# Patient Record
Sex: Male | Born: 1940 | Race: White | Hispanic: No | Smoking: Former smoker
Health system: Southern US, Community
[De-identification: ages and names within clinical notes are randomized; demographics above are authoritative.]

## PROBLEM LIST (undated history)

## (undated) DIAGNOSIS — I739 Peripheral vascular disease, unspecified: Secondary | ICD-10-CM

## (undated) DIAGNOSIS — I2510 Atherosclerotic heart disease of native coronary artery without angina pectoris: Secondary | ICD-10-CM

## (undated) DIAGNOSIS — Z87442 Personal history of urinary calculi: Secondary | ICD-10-CM

## (undated) DIAGNOSIS — I714 Abdominal aortic aneurysm, without rupture, unspecified: Secondary | ICD-10-CM

## (undated) DIAGNOSIS — IMO0002 Reserved for concepts with insufficient information to code with codable children: Secondary | ICD-10-CM

## (undated) DIAGNOSIS — K5790 Diverticulosis of intestine, part unspecified, without perforation or abscess without bleeding: Secondary | ICD-10-CM

## (undated) DIAGNOSIS — M1990 Unspecified osteoarthritis, unspecified site: Secondary | ICD-10-CM

## (undated) DIAGNOSIS — K8689 Other specified diseases of pancreas: Secondary | ICD-10-CM

## (undated) DIAGNOSIS — I10 Essential (primary) hypertension: Secondary | ICD-10-CM

## (undated) DIAGNOSIS — I701 Atherosclerosis of renal artery: Secondary | ICD-10-CM

## (undated) DIAGNOSIS — I219 Acute myocardial infarction, unspecified: Secondary | ICD-10-CM

## (undated) DIAGNOSIS — N200 Calculus of kidney: Secondary | ICD-10-CM

## (undated) DIAGNOSIS — I779 Disorder of arteries and arterioles, unspecified: Secondary | ICD-10-CM

## (undated) DIAGNOSIS — R918 Other nonspecific abnormal finding of lung field: Secondary | ICD-10-CM

## (undated) DIAGNOSIS — E785 Hyperlipidemia, unspecified: Secondary | ICD-10-CM

## (undated) HISTORY — PX: TONSILLECTOMY: SUR1361

## (undated) HISTORY — DX: Diverticulosis of intestine, part unspecified, without perforation or abscess without bleeding: K57.90

## (undated) HISTORY — PX: CORONARY ARTERY BYPASS GRAFT: SHX141

## (undated) HISTORY — DX: Hyperlipidemia, unspecified: E78.5

## (undated) HISTORY — DX: Atherosclerosis of renal artery: I70.1

## (undated) HISTORY — DX: Abdominal aortic aneurysm, without rupture, unspecified: I71.40

## (undated) HISTORY — DX: Personal history of urinary calculi: Z87.442

## (undated) HISTORY — DX: Acute myocardial infarction, unspecified: I21.9

## (undated) HISTORY — DX: Reserved for concepts with insufficient information to code with codable children: IMO0002

## (undated) HISTORY — DX: Peripheral vascular disease, unspecified: I73.9

## (undated) HISTORY — DX: Other nonspecific abnormal finding of lung field: R91.8

## (undated) HISTORY — DX: Other specified diseases of pancreas: K86.89

## (undated) HISTORY — DX: Disorder of arteries and arterioles, unspecified: I77.9

## (undated) HISTORY — DX: Unspecified osteoarthritis, unspecified site: M19.90

## (undated) HISTORY — PX: COLONOSCOPY: SHX174

## (undated) HISTORY — DX: Essential (primary) hypertension: I10

## (undated) HISTORY — PX: SHOULDER SURGERY: SHX246

## (undated) HISTORY — DX: Atherosclerotic heart disease of native coronary artery without angina pectoris: I25.10

---

## 1998-01-28 ENCOUNTER — Other Ambulatory Visit: Admission: RE | Admit: 1998-01-28 | Discharge: 1998-01-28 | Payer: Self-pay | Admitting: Internal Medicine

## 1998-12-13 ENCOUNTER — Encounter: Admission: RE | Admit: 1998-12-13 | Discharge: 1998-12-13 | Payer: Self-pay | Admitting: Family Medicine

## 1998-12-13 ENCOUNTER — Encounter: Payer: Self-pay | Admitting: Family Medicine

## 2000-08-06 ENCOUNTER — Ambulatory Visit (HOSPITAL_COMMUNITY): Admission: RE | Admit: 2000-08-06 | Discharge: 2000-08-06 | Payer: Self-pay | Admitting: Cardiology

## 2000-08-06 ENCOUNTER — Encounter: Payer: Self-pay | Admitting: Cardiology

## 2002-01-02 ENCOUNTER — Encounter: Admission: RE | Admit: 2002-01-02 | Discharge: 2002-01-02 | Payer: Self-pay | Admitting: Cardiology

## 2002-01-02 ENCOUNTER — Encounter: Payer: Self-pay | Admitting: Cardiology

## 2005-03-24 ENCOUNTER — Ambulatory Visit (HOSPITAL_COMMUNITY): Admission: RE | Admit: 2005-03-24 | Discharge: 2005-03-24 | Payer: Self-pay | Admitting: Orthopedic Surgery

## 2005-08-30 HISTORY — PX: OTHER SURGICAL HISTORY: SHX169

## 2005-09-28 ENCOUNTER — Inpatient Hospital Stay (HOSPITAL_COMMUNITY): Admission: EM | Admit: 2005-09-28 | Discharge: 2005-10-03 | Payer: Self-pay | Admitting: Emergency Medicine

## 2005-09-29 ENCOUNTER — Encounter (INDEPENDENT_AMBULATORY_CARE_PROVIDER_SITE_OTHER): Payer: Self-pay | Admitting: *Deleted

## 2005-10-26 ENCOUNTER — Encounter: Admission: RE | Admit: 2005-10-26 | Discharge: 2005-10-26 | Payer: Self-pay | Admitting: Cardiothoracic Surgery

## 2006-01-30 DIAGNOSIS — I219 Acute myocardial infarction, unspecified: Secondary | ICD-10-CM

## 2006-01-30 HISTORY — DX: Acute myocardial infarction, unspecified: I21.9

## 2007-01-31 ENCOUNTER — Emergency Department (HOSPITAL_COMMUNITY): Admission: EM | Admit: 2007-01-31 | Discharge: 2007-01-31 | Payer: Self-pay | Admitting: Emergency Medicine

## 2008-01-02 ENCOUNTER — Encounter: Payer: Self-pay | Admitting: Cardiovascular Disease

## 2008-03-17 ENCOUNTER — Encounter (INDEPENDENT_AMBULATORY_CARE_PROVIDER_SITE_OTHER): Payer: Self-pay | Admitting: *Deleted

## 2008-07-03 ENCOUNTER — Encounter: Admission: RE | Admit: 2008-07-03 | Discharge: 2008-07-03 | Payer: Self-pay | Admitting: Family Medicine

## 2008-08-31 ENCOUNTER — Encounter: Payer: Self-pay | Admitting: Cardiovascular Disease

## 2008-08-31 LAB — CONVERTED CEMR LAB
AST: 21 units/L
Alkaline Phosphatase: 67 units/L
Cholesterol: 145 mg/dL
Total Bilirubin: 0.6 mg/dL
Triglycerides: 168 mg/dL

## 2009-03-03 ENCOUNTER — Ambulatory Visit: Payer: Self-pay | Admitting: Cardiovascular Disease

## 2009-03-03 DIAGNOSIS — I25810 Atherosclerosis of coronary artery bypass graft(s) without angina pectoris: Secondary | ICD-10-CM | POA: Insufficient documentation

## 2009-03-03 DIAGNOSIS — E785 Hyperlipidemia, unspecified: Secondary | ICD-10-CM

## 2009-03-03 DIAGNOSIS — I10 Essential (primary) hypertension: Secondary | ICD-10-CM | POA: Insufficient documentation

## 2009-03-09 ENCOUNTER — Encounter: Payer: Self-pay | Admitting: Cardiovascular Disease

## 2009-03-18 ENCOUNTER — Encounter: Payer: Self-pay | Admitting: Cardiovascular Disease

## 2009-05-10 ENCOUNTER — Emergency Department (HOSPITAL_COMMUNITY): Admission: EM | Admit: 2009-05-10 | Discharge: 2009-05-10 | Payer: Self-pay | Admitting: Emergency Medicine

## 2009-05-14 ENCOUNTER — Ambulatory Visit: Payer: Self-pay | Admitting: Cardiology

## 2009-05-14 DIAGNOSIS — H8110 Benign paroxysmal vertigo, unspecified ear: Secondary | ICD-10-CM

## 2009-05-28 ENCOUNTER — Telehealth: Payer: Self-pay | Admitting: Cardiovascular Disease

## 2009-05-28 ENCOUNTER — Ambulatory Visit: Payer: Self-pay | Admitting: Cardiology

## 2009-05-31 LAB — CONVERTED CEMR LAB
BUN: 13 mg/dL (ref 6–23)
CO2: 26 meq/L (ref 19–32)
Calcium: 9.1 mg/dL (ref 8.4–10.5)
Chloride: 106 meq/L (ref 96–112)
Creatinine, Ser: 1.12 mg/dL (ref 0.40–1.50)
Glucose, Bld: 101 mg/dL — ABNORMAL HIGH (ref 70–99)
Potassium: 4.3 meq/L (ref 3.5–5.3)
Sodium: 140 meq/L (ref 135–145)

## 2009-06-02 ENCOUNTER — Encounter: Payer: Self-pay | Admitting: Cardiovascular Disease

## 2009-06-11 ENCOUNTER — Telehealth: Payer: Self-pay | Admitting: Cardiology

## 2009-07-07 ENCOUNTER — Encounter: Payer: Self-pay | Admitting: Cardiovascular Disease

## 2009-09-02 ENCOUNTER — Ambulatory Visit: Payer: Self-pay | Admitting: Cardiovascular Disease

## 2009-09-06 ENCOUNTER — Ambulatory Visit: Payer: Self-pay | Admitting: Cardiovascular Disease

## 2009-09-08 LAB — CONVERTED CEMR LAB
ALT: 21 units/L (ref 0–53)
AST: 23 units/L (ref 0–37)
Albumin: 4.3 g/dL (ref 3.5–5.2)
Alkaline Phosphatase: 60 units/L (ref 39–117)
Bilirubin, Direct: 0.2 mg/dL (ref 0.0–0.3)
Cholesterol: 123 mg/dL (ref 0–200)
HDL: 48 mg/dL (ref >39)
Indirect Bilirubin: 0.5 mg/dL (ref 0.0–0.9)
LDL Cholesterol: 62 mg/dL (ref 0–99)
Total Bilirubin: 0.7 mg/dL (ref 0.3–1.2)
Total CHOL/HDL Ratio: 2.6
Total Protein: 6.5 g/dL (ref 6.0–8.3)
Triglycerides: 67 mg/dL (ref <150)
VLDL: 13 mg/dL (ref 0–40)

## 2009-09-30 ENCOUNTER — Telehealth: Payer: Self-pay | Admitting: Cardiovascular Disease

## 2010-01-04 ENCOUNTER — Telehealth: Payer: Self-pay | Admitting: Cardiology

## 2010-02-09 ENCOUNTER — Encounter: Payer: Self-pay | Admitting: Cardiovascular Disease

## 2010-02-09 ENCOUNTER — Ambulatory Visit
Admission: RE | Admit: 2010-02-09 | Discharge: 2010-02-09 | Payer: Self-pay | Source: Home / Self Care | Attending: Cardiovascular Disease | Admitting: Cardiovascular Disease

## 2010-02-10 ENCOUNTER — Ambulatory Visit
Admission: RE | Admit: 2010-02-10 | Discharge: 2010-02-10 | Payer: Self-pay | Source: Home / Self Care | Attending: Cardiovascular Disease | Admitting: Cardiovascular Disease

## 2010-02-10 ENCOUNTER — Encounter: Payer: Self-pay | Admitting: Cardiovascular Disease

## 2010-02-11 ENCOUNTER — Inpatient Hospital Stay (HOSPITAL_COMMUNITY)
Admission: AD | Admit: 2010-02-11 | Discharge: 2010-02-14 | Payer: Self-pay | Source: Home / Self Care | Attending: Orthopedic Surgery | Admitting: Orthopedic Surgery

## 2010-02-14 LAB — CBC
HCT: 43.5 % (ref 39.0–52.0)
HCT: 39 % (ref 39.0–52.0)
HCT: 39.5 % (ref 39.0–52.0)
Hemoglobin: 14.9 g/dL (ref 13.0–17.0)
Hemoglobin: 13 g/dL (ref 13.0–17.0)
Hemoglobin: 13.4 g/dL (ref 13.0–17.0)
MCH: 32 pg (ref 26.0–34.0)
MCH: 31.4 pg (ref 26.0–34.0)
MCH: 31.7 pg (ref 26.0–34.0)
MCHC: 34.3 g/dL (ref 30.0–36.0)
MCHC: 33.3 g/dL (ref 30.0–36.0)
MCHC: 33.9 g/dL (ref 30.0–36.0)
MCV: 93.5 fL (ref 78.0–100.0)
MCV: 94.2 fL (ref 78.0–100.0)
MCV: 93.4 fL (ref 78.0–100.0)
Platelets: 139 10*3/uL — ABNORMAL LOW (ref 150–400)
Platelets: 131 10*3/uL — ABNORMAL LOW (ref 150–400)
Platelets: 143 10*3/uL — ABNORMAL LOW (ref 150–400)
RBC: 4.65 MIL/uL (ref 4.22–5.81)
RBC: 4.14 MIL/uL — ABNORMAL LOW (ref 4.22–5.81)
RBC: 4.23 MIL/uL (ref 4.22–5.81)
RDW: 13.2 % (ref 11.5–15.5)
RDW: 13.1 % (ref 11.5–15.5)
RDW: 12.9 % (ref 11.5–15.5)
WBC: 12.5 10*3/uL — ABNORMAL HIGH (ref 4.0–10.5)
WBC: 7 10*3/uL (ref 4.0–10.5)
WBC: 8.7 10*3/uL (ref 4.0–10.5)

## 2010-02-14 LAB — COMPREHENSIVE METABOLIC PANEL
ALT: 20 U/L (ref 0–53)
AST: 19 U/L (ref 0–37)
Albumin: 3.8 g/dL (ref 3.5–5.2)
Alkaline Phosphatase: 60 U/L (ref 39–117)
BUN: 15 mg/dL (ref 6–23)
CO2: 26 mEq/L (ref 19–32)
Calcium: 9.1 mg/dL (ref 8.4–10.5)
Chloride: 104 mEq/L (ref 96–112)
Creatinine, Ser: 1.21 mg/dL (ref 0.4–1.5)
GFR calc Af Amer: >60 mL/min (ref >60)
GFR calc non Af Amer: 59 mL/min — ABNORMAL LOW (ref >60)
Glucose, Bld: 115 mg/dL — ABNORMAL HIGH (ref 70–99)
Potassium: 4.3 mEq/L (ref 3.5–5.1)
Sodium: 140 mEq/L (ref 135–145)
Total Bilirubin: 1 mg/dL (ref 0.3–1.2)
Total Protein: 7.4 g/dL (ref 6.0–8.3)

## 2010-02-14 LAB — CONVERTED CEMR LAB
ALT: 19 units/L (ref 0–53)
AST: 18 units/L (ref 0–37)
Alkaline Phosphatase: 67 units/L (ref 39–117)
Bilirubin, Direct: 0.2 mg/dL (ref 0.0–0.3)
Cholesterol: 134 mg/dL (ref 0–200)
Total Bilirubin: 0.8 mg/dL (ref 0.3–1.2)
Total CHOL/HDL Ratio: 2.8

## 2010-02-14 LAB — URINALYSIS, ROUTINE W REFLEX MICROSCOPIC
Hgb urine dipstick: NEGATIVE
Ketones, ur: 40 mg/dL — AB
Leukocytes, UA: NEGATIVE
Nitrite: NEGATIVE
Protein, ur: 30 mg/dL — AB
Specific Gravity, Urine: 1.028 (ref 1.005–1.030)
Urine Glucose, Fasting: NEGATIVE mg/dL
Urobilinogen, UA: 0.2 mg/dL (ref 0.0–1.0)
pH: 5.5 (ref 5.0–8.0)

## 2010-02-14 LAB — URINE MICROSCOPIC-ADD ON

## 2010-02-14 LAB — SEDIMENTATION RATE: Sed Rate: 29 mm/hr — ABNORMAL HIGH (ref 0–16)

## 2010-02-16 LAB — CBC
HCT: 40 % (ref 39.0–52.0)
Hemoglobin: 13.4 g/dL (ref 13.0–17.0)
MCH: 31.1 pg (ref 26.0–34.0)
MCHC: 33.5 g/dL (ref 30.0–36.0)
MCV: 92.8 fL (ref 78.0–100.0)
Platelets: 152 10*3/uL (ref 150–400)
RBC: 4.31 MIL/uL (ref 4.22–5.81)
RDW: 12.7 % (ref 11.5–15.5)
WBC: 7.4 10*3/uL (ref 4.0–10.5)

## 2010-02-16 LAB — CREATININE, SERUM
Creatinine, Ser: 1.02 mg/dL (ref 0.4–1.5)
GFR calc Af Amer: >60 mL/min (ref >60)
GFR calc non Af Amer: >60 mL/min (ref >60)

## 2010-02-27 NOTE — Discharge Summary (Addendum)
  [name], [name]                ACCOUNT NO.:  [id number]  MEDICAL RECORD NO.:  [id number]          PATIENT TYPE:  INP  LOCATION:  1333                         FACILITY:  [facility]  PHYSICIAN:  Georges Lynch. Gioffre, M.D.DATE OF BIRTH:  1940/02/15  DATE OF ADMISSION:  02/11/2010 DATE OF DISCHARGE:  02/14/2010                              DISCHARGE SUMMARY   ADMITTING DIAGNOSIS:  Left prepatellar cellulitis.  DISCHARGE DIAGNOSIS:  Left prepatellar cellulitis improving after IV antibiotics.  LABORATORY DATA:  Admitting labs taken on February 11, 2010 revealed a white count of 12.5, hemoglobin 14.9, hematocrit 43.5 and platelet count of 139,000.  Chemistry panel revealed mildly elevated glucose at 115 and urine was positive for ketones and protein with some mucus.  On the admission day 2, CBC revealed white count that had normalized to 7.0, hemoglobin 13, hematocrit of 39.0, and platelet count of 131,000. The patient's sed rate was elevated at 29.  On admission day 3, white count remained within normal limits at 8.7, hemoglobin 13.4, hematocrit 39.5, and platelet count of 143,000. On admission day which is also discharge day, the patient's white count again remained within normal limits at 7.4, hemoglobin 13.4, hematocrit 40, and platelet count of 152,000.  HOSPITAL COURSE:  The patient was seen at [facility] on February 11, 2010 and had what appeared to be a cellulitis developing on the anterior aspect of the left knee.  He was sent to [facility] for admission to receive IV antibiotics to treat cellulitis. On February 12, 2010, the patient's white count normalized after receiving IV antibiotics overnight.  On February 14, 2010, the patient's cellulitis seemed to be greatly improved and he was discharged home on p.o. antibiotics.  DISPOSITION:  To home on February 14, 2010.  PROCEDURE:  None.  DISCHARGE MEDICATIONS: 1. Multivitamin. 2. Amlodipine. 3.  Niacor. 4. Aspirin. 5. Lisinopril. 6. Metoprolol. 7. Simvastatin. 8. Doxycycline. 9. Oxycodone/acetaminophen.  DIET:  No restrictions.  WOUND CARE:  Not applicable.  ACTIVITY:  He should increase his activity slowly.  FOLLOWUP:  He will follow up with Dr. Darrelyn Hillock on Thursday.  He should call the office at [phone] to schedule this appointment.  CONDITION ON DISCHARGE:  Improving.     Rozell Searing, PAC   ______________________________ Georges Lynch Darrelyn Hillock, M.D.    LD/MEDQ  D:  02/24/2010  T:  02/24/2010  Job:  [id number]  Electronically Signed by Rozell Searing  on 02/27/2010 03:53:31 PM Electronically Signed by Ranee Gosselin M.D. on 02/28/2010 08:05:54 AM

## 2010-03-01 NOTE — Assessment & Plan Note (Signed)
Summary: 6 months   Visit Type:  Follow-up Primary [name]:  Dr. Jeannetta Nap   History of Present Illness: [name] is a 70 yo,    coronary artery disease, bypass surgery in August of 2007 with a LIMA  to the LAD, vein graft to the diagonal, and graft to the OM and PDA of the circumflex, as well as history of hypertension and hyperlipidemia.  He presents for routine follow up. He has recently recovered from vertigo, seen by ENT.  BP has been stable. He has noted some mild swelling of his LE b/l. He has been active, remodeling a house. No chest pain, no SOB. Otherwise feels very good when active.  EKG: NSR with rate of 64 bpm, nonspecific ST changes in inferior leads.  Current Medications (verified): 1)  Metoprolol Tartrate 25 Mg Tabs (Metoprolol Tartrate) .... Take One Tablet By Mouth Twice A Day 2)  Lisinopril 20 Mg Tabs (Lisinopril) .... Take One Tablet By Mouth Daily 3)  Simvastatin 40 Mg Tabs (Simvastatin) .... Take One Tablet By Mouth Daily At Bedtime 4)  Niacor 500 Mg Tabs (Niacin) .... 2 By Mouth Two Times A Day 5)  One-A-Day Extras Antioxidant  Caps (Multiple Vitamins-Minerals) .Marland Kitchen.. 1 By Mouth Once Daily 6)  Aspirin 81 Mg Tbec (Aspirin) .... Take 2 Tablets By Mouth Daily 7)  Meclizine Hcl 25 Mg Tabs (Meclizine Hcl) .... Take 1 By Mouth Three Times A Day As Needed For Dizziness 8)  Amlodipine Besylate 10 Mg Tabs (Amlodipine Besylate) .... Take One Tablet By Mouth Daily  Allergies (verified): 1)  ! Codeine  Past History:  Past Medical History: Last updated: 05/14/2009 1. Arthritis 2. CAD:  CABG 2007 with LIMA-LAD,  SVG-D, sequential SVG-OM and CFX PDA.  Myoview 12/09 was probably a negative study with attenuation artifact.  3. Hyperlipidemia 4. Hypertension 5. Positional vertigo  Past Surgical History: Last updated: 03/09/2009 CABG Bypass surgery August 2007  Family History: Last updated: 03-13-2009 Father: deceased 74 : alzeimer's Mother: deceased 72: old  age Sister deceased 50: DM  lots of health problems  Social History: Last updated: Mar 13, 2009 Retired  Married  Tobacco Use - Former.  Alcohol Use - no Regular Exercise - yes Drug Use - no  Risk Factors: Alcohol Use: 0 (Mar 13, 2009) Caffeine Use: no (03-13-2009) Exercise: yes (March 13, 2009)  Risk Factors: Smoking Status: quit (03-13-09)  Review of Systems       The patient complains of peripheral edema.  The patient denies fever, weight loss, weight gain, vision loss, decreased hearing, hoarseness, chest pain, syncope, dyspnea on exertion, prolonged cough, abdominal pain, incontinence, muscle weakness, depression, and enlarged lymph nodes.    Vital Signs:  Patient profile:   70 year old male Height:      72.5 inches Weight:      219 pounds BMI:     29.40 Pulse rate:   65 / minute BP sitting:   163 / 87  (left arm) Cuff size:   regular  Vitals Entered By: Bishop Dublin, CMA (September 02, 2009 10:41 AM)  Physical Exam  General:  Well developed, well nourished, in no acute distress. Head:  normocephalic and atraumatic Neck:  Neck supple, no JVD. No masses, thyromegaly or abnormal cervical nodes. Chest Wall:  no deformities or breast masses noted Lungs:  Clear bilaterally to auscultation and percussion. Heart:  Non-displaced PMI, chest non-tender; regular rate and rhythm, S1, S2 without murmurs, rubs or gallops. Carotid upstroke normal, no bruit.  Pedals normal pulses. Trace LE  edema b/l, no varicosities. Abdomen:  Bowel sounds positive; abdomen soft and non-tender without masses Msk:  Back normal, normal gait. Muscle strength and tone normal. Pulses:  pulses normal in all 4 extremities Extremities:  No clubbing or cyanosis. Neurologic:  Alert and oriented x 3. Skin:  Intact without lesions or rashes. Psych:  Normal affect.   Impression & Recommendations:  Problem # 1:  CORONARY ATHEROSCLEROSIS OF ARTERY BYPASS GRAFT (ICD-414.04) No signs of angina. Very active at  baseline. no further testing at this time.   His updated medication list for this problem includes:    Metoprolol Tartrate 25 Mg Tabs (Metoprolol tartrate) .Marland Kitchen... Take one tablet by mouth twice a day    Lisinopril 20 Mg Tabs (Lisinopril) .Marland Kitchen... Take one tablet by mouth daily    Aspirin 81 Mg Tbec (Aspirin) .Marland Kitchen... Take 2 tablets by mouth daily    Amlodipine Besylate 10 Mg Tabs (Amlodipine besylate) .Marland Kitchen... Take one tablet by mouth daily  Orders: EKG w/ Interpretation (93000)  Problem # 2:  HYPERTENSION, BENIGN (ICD-401.1) BP significantly improved on recheck. 125/80. will continue meds at their current doses. He feels well. His updated medication list for this problem includes:    Metoprolol Tartrate 25 Mg Tabs (Metoprolol tartrate) .Marland Kitchen... Take one tablet by mouth twice a day    Lisinopril 20 Mg Tabs (Lisinopril) .Marland Kitchen... Take one tablet by mouth daily    Aspirin 81 Mg Tbec (Aspirin) .Marland Kitchen... Take 2 tablets by mouth daily    Amlodipine Besylate 10 Mg Tabs (Amlodipine besylate) .Marland Kitchen... Take one tablet by mouth daily  Problem # 3:  HYPERLIPIDEMIA (ICD-272.4) Lipids last year were well controlled.  will recheck lipids/lfts tomorrow.  His updated medication list for this problem includes:    Simvastatin 40 Mg Tabs (Simvastatin) .Marland Kitchen... Take one tablet by mouth daily at bedtime    Niacor 500 Mg Tabs (Niacin) .Marland Kitchen... 2 by mouth two times a day  Patient Instructions: 1)  Your physician recommends that you continue on your current medications as directed. Please refer to the Current Medication list given to you today. 2)  Your physician wants you to follow-up in:   6 months You will receive a reminder letter in the mail two months in advance. If you don't receive a letter, please call our office to schedule the follow-up appointment. 3)  Your physician recommends that you return for a FASTING lipid profile: (lip/lft)

## 2010-03-01 NOTE — Consult Note (Signed)
Summary: [facility]   Imported By: Harlon Flor 07/07/2009 15:31:03  _____________________________________________________________________  External Attachment:    Type:   Image     Comment:   External Document

## 2010-03-01 NOTE — Progress Notes (Signed)
Summary: PHI  PHI   Imported By: Harlon Flor 03/04/2009 16:54:30  _____________________________________________________________________  External Attachment:    Type:   Image     Comment:   External Document

## 2010-03-01 NOTE — Progress Notes (Signed)
Summary: RX amlodipine  Phone Note Refill Request Call back at Home Phone [phone] Message from:  Patient on September 30, 2009 8:59 AM  Refills Requested: Medication #1:  AMLODIPINE BESYLATE 10 MG TABS Take one tablet by mouth daily. [facility] [phone]  Initial call taken by: Harlon Flor,  September 30, 2009 9:00 AM    Prescriptions: AMLODIPINE BESYLATE 10 MG TABS (AMLODIPINE BESYLATE) Take one tablet by mouth daily  #30 x 3   Entered by:   Bishop Dublin, CMA   Authorized by:   Dossie Arbour MD   Signed by:   Bishop Dublin, CMA on 09/30/2009   Method used:   Electronically to        [facility]* (retail)       [address]       [place], [place]  [address]       Ph: [phone]       Fax: [phone]   RxID:   [id number]

## 2010-03-01 NOTE — Letter (Signed)
Summary: Medical Record Release  Medical Record Release   Imported By: Harlon Flor 03/18/2009 15:17:41  _____________________________________________________________________  External Attachment:    Type:   Image     Comment:   External Document

## 2010-03-01 NOTE — Assessment & Plan Note (Signed)
Summary: ROV/AMD   Primary Provider:  Dr. Jeannetta Greene  CC:  ROV; Still C/O dizzy/nausea while getting up/quick movements; No Chest discomfort.  History of Present Illness: [name] is a 70 yo patient of Dr. Windell Greene.   He has a history of coronary artery disease, bypass surgery in August of 2007 with a LIMA  to the LAD, vein graft to the diagonal, and graft to the OM and PDA of the circumflex, as well as history of hypertension and hyperlipidemia.  He presents for cardiology evaluation after recent urgent care visit at [name].   For 3 wks, patient has been having vertigo symptoms.  When he turns his head to the left abruptly or when he bends down and stands up quickly, he gets a spinning sensation and feels nausea.  The symptoms will last < 1 minute but are very bothersome.  This has gradually gotten worse over that time.  He has not fallen but has been unsteady when he gets the spinning sensation.  He went to urgent care at [name] and it was noted that his systolic blood pressure was in the 160s-170s.  They told him to followup with his cardiologist to make sure that this was not causing the symptoms.  They gave him meclizine which has not been doing much to help him.  He brings in home BP readings which have ranged from 143-160/92-110.  This is considerably higher than normal for him.    We reproduced his symptoms with him standing up, bending over, then straightening up again.  He had a few beats of nystagmus.   Current Medications (verified): 1)  Metoprolol Tartrate 25 Mg Tabs (Metoprolol Tartrate) .... Take One Tablet By Mouth Twice A Day 2)  Lisinopril 10 Mg Tabs (Lisinopril) .... Take One Tablet By Mouth Daily 3)  Simvastatin 40 Mg Tabs (Simvastatin) .... Take One Tablet By Mouth Daily At Bedtime 4)  Niacor 500 Mg Tabs (Niacin) .... 2 By Mouth Two Times A Day 5)  One-A-Day Extras Antioxidant  Caps (Multiple Vitamins-Minerals) .Marland Kitchen.. 1 By Mouth Once Daily 6)  Aspirin 81 Mg Tbec  (Aspirin) .... Take 2 Tablets By Mouth Daily 7)  Meclizine Hcl 25 Mg Tabs (Meclizine Hcl) .... Take 1 By Mouth Three Times A Day As Needed For Dizziness  Allergies: 1)  ! Codeine  Past History:  Past Medical History: 1. Arthritis 2. CAD:  CABG 2007 with LIMA-LAD,  SVG-D, sequential SVG-OM and CFX PDA.  Myoview 12/09 was probably a negative study with attenuation artifact.  3. Hyperlipidemia 4. Hypertension 5. Positional vertigo  Family History: Reviewed history from 03/03/2009 and no changes required. Father: deceased 11 : alzeimer's Mother: deceased 50: old age Sister deceased 53: DM  lots of health problems  Social History: Reviewed history from 03/03/2009 and no changes required. Retired  Married  Tobacco Use - Former.  Alcohol Use - no Regular Exercise - yes Drug Use - no  Review of Systems       All systems reviewed and negative except as per HPI.   Vital Signs:  Patient profile:   71 year old male Height:      72.5 inches Weight:      224 pounds Pulse rate:   72 / minute Pulse rhythm:   regular BP sitting:   154 / 88  (left arm)  Vitals Entered By: [name], EMT-P (May 14, 2009 9:50 AM)  Physical Exam  General:  well-appearing gentleman in no apparent distress. HEENT exam  is benign, oropharynx is clear, neck is supple with no JVP or carotid bruits heart sounds are regular with normal S1 and S2 and no murmurs appreciated lungs are clear to auscultation with no wheezes or rales abdominal exam is benign and he has no significant lower extremity edema. Pulses are equal and symmetrical in his upper and lower extremities.   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  We were able to reproduce his symptoms in the office today with a few beats of nystagmus noted.  I recommended that he see an ENT for Epley maneuvers.  He would like to see Dr. Jac Greene, we will arrange for  this.   Problem # 2:  HYPERTENSION, BENIGN (ICD-401.1) BP has been elevated since the vertigo developed.  I suspect it is the vertigo causing some anxiety and discomfort that drives the blood pressure, rather than the other way around.  I will have him increase lisinopril to 20 mg daily with BMET in 2 wks to follow renal fxn and K.  He will call us if his BP does not stabilize out.   Followup with Dr. Mariah Greene as scheduled.   Other Orders: ENT Referral (ENT)  Patient Instructions: 1)  Your physician recommends that you schedule a follow-up appointment in: July with Dr. Mariah Greene 2)  Your physician recommends that you return for lab work in: 2 weeks (bmet) 3)  Your physician has recommended you make the following change in your medication: increase lisinopril to 20 mg daily  4)  You have been referred to Dr. Jac Greene ENT Prescriptions: LISINOPRIL 20 MG TABS (LISINOPRIL) Take one tablet by mouth daily  #30 x 6   Entered by:   Bradley Cross, RN, BSN   Authorized by:   Bradley Ancona, MD   Signed by:   Bradley Cross, RN, BSN on 05/14/2009   Method used:   Electronically to        [facility]* (retail)       [address]       [place], [place]  [address]       Ph: [phone]       Fax: [phone]   RxID:   [id number]

## 2010-03-01 NOTE — Progress Notes (Signed)
Summary: BP issues  Phone Note Other Incoming   Summary of Call: pt in for labwork today.  states that he was not feeling well yesterday so he went to fire station and had BP checked.  BP 160/98 HR 80.   Initial call taken by: Charlena Cross, RN, BSN,  May 28, 2009 4:13 PM  Follow-up for Phone Call        Add amlodipine 10 mg daily for BP (start with 5 mg to start x 1 week, monitor BP), continue lisonopril     Appended Document: BP issues will call in rx to pharmacy at [facility]. pt aware.

## 2010-03-01 NOTE — Miscellaneous (Signed)
Summary: med update  Clinical Lists Changes  Medications: Added new medication of AMLODIPINE BESYLATE 10 MG TABS (AMLODIPINE BESYLATE) Take one tablet by mouth daily - Signed Rx of AMLODIPINE BESYLATE 10 MG TABS (AMLODIPINE BESYLATE) Take one tablet by mouth daily;  #30 x 3;  Signed;  Entered by: Mercer Pod;  Authorized by: Dossie Arbour MD;  Method used: Electronically to [facility]*, [address], [place], [place]  [address], Ph: [phone], Fax: [phone]    Prescriptions: AMLODIPINE BESYLATE 10 MG TABS (AMLODIPINE BESYLATE) Take one tablet by mouth daily  #30 x 3   Entered by:   Mercer Pod   Authorized by:   Dossie Arbour MD   Signed by:   Mercer Pod on 06/02/2009   Method used:   Electronically to        [facility]* (retail)       [address]       [place], [place]  [address]       Ph: [phone]       Fax: [phone]   RxID:   [id number]

## 2010-03-01 NOTE — Progress Notes (Signed)
Summary: MEDICATION  Phone Note Call from Patient Call back at Home Phone [phone]   Caller: WIFE Call For: [name] Summary of Call: WAS GIVEN ON 30 DAYS WORTH OF 20 MG OF LISINOPRIL-SHOULD HE CONTINUE THIS AND GET AN RX OR WOULD YOU LIKE FOR HIM TO GO BACK TO THE 10 MG? Initial call taken by: Harlon Flor,  Jun 11, 2009 11:06 AM  Follow-up for Phone Call        At OV on 05/14/09 Dr [name] incr Lisinopril to 20mg  once daily rx for 30 with 6 refills sent to pharmacy.  Attempted TCB pt.  LMOM TCB. Cloyde Reams RN  Jun 11, 2009 12:05 PM   Spoke with pt advised per Dr Kathlyn Sacramento OV note from 05/14/09 he is supposed to continue on Lisinopril 20mg  once daily.  Rx was sent with refills.  Pt agrees to continue states his BP has been good with this new rx 130's/70's-80's. Follow-up by: Cloyde Reams RN,  Jun 11, 2009 2:30 PM

## 2010-03-01 NOTE — Assessment & Plan Note (Signed)
Summary: NP6/AMD   Visit Type:  New Patient Primary Provider:  Dr. Jeannetta Nap  CC:  no complaints.  History of Present Illness: [name] is a very pleasant 70 year old gentleman last seen by myself at [facility] on August 2000 and who presents to the power to establish care. He has a history of coronary artery disease, bypass surgery in August of 2007 with a LIMA  to the LAD, vein graft to the diagonal, and graft to the OM and PDA of the circumflex, history of hypertension, hyperlipidemia who presents for review of his medical issues.   [name] states that he is feeling well. He denies any significant shortness of breath. He does have rare episodes of chest pain though none recently. He did have one episode several months ago that seemed to resolve when he rolled over onto his side. He is active but does not participate in a regular exercise program. He had a negative stress test in 2010. No significant changes since he was last seen several months ago. His cholesterol has typically been well controlled on his current medication regiment as has his blood pressure.  Preventive Screening-Counseling & Management  Alcohol-Tobacco     Alcohol drinks/day: 0     Smoking Status: quit     Year Quit: about 30 years ago  Caffeine-Diet-Exercise     Caffeine use/day: no     Does Patient Exercise: yes     Type of exercise: walk      Drug Use:  no.    Current Problems (verified): 1)  Hyperlipidemia  (ICD-272.4) 2)  Hypertension, Benign  (ICD-401.1) 3)  Coronary Atherosclerosis of Artery Bypass Graft  (ICD-414.04)  Current Medications (verified): 1)  Metoprolol Tartrate 25 Mg Tabs (Metoprolol Tartrate) .... Take One Tablet By Mouth Twice A Day 2)  Lisinopril 10 Mg Tabs (Lisinopril) .... Take One Tablet By Mouth Daily 3)  Simvastatin 40 Mg Tabs (Simvastatin) .... Take One Tablet By Mouth Daily At Bedtime 4)  Niacor 500 Mg Tabs (Niacin) .... 2 By Mouth Two Times A  Day 5)  One-A-Day Extras Antioxidant  Caps (Multiple Vitamins-Minerals) .Marland Kitchen.. 1 By Mouth Once Daily 6)  Aspirin 81 Mg Tbec (Aspirin) .... Take One Tablet By Mouth Daily  Allergies (verified): 1)  ! Codeine  Past History:  Past Medical History: Arthritis CAD Hyperlipidemia Hypertension  Past Surgical History: CABG  Family History: Father: deceased 60 : alzeimer's Mother: deceased 44: old age Sister deceased 25: DM  lots of health problems  Social History: Retired  Married  Tobacco Use - Former.  Alcohol Use - no Regular Exercise - yes Drug Use - no Alcohol drinks/day:  0 Smoking Status:  quit Caffeine use/day:  no Does Patient Exercise:  yes Drug Use:  no  Vital Signs:  Patient profile:   70 year old male Height:      74.5 inches Weight:      220.50 pounds BMI:     28.03 Pulse rate:   61 / minute Pulse rhythm:   regular BP sitting:   130 / 86  (right arm) Cuff size:   regular  Vitals Entered By: [name] (March 03, 2009 11:25 AM)  Physical Exam  General:  well-appearing gentleman in no apparent distress. HEENT exam is benign, oropharynx is clear, neck is supple with no JVP or carotid bruits heart sounds are regular with normal S1 and S2 and no murmurs appreciated lungs are clear to auscultation with no wheezes or  rales abdominal exam is benign and he has no significant lower extremity edema. Pulses are equal and symmetrical in his upper and lower extremities.   New Orders:     1)  EKG w/ Interpretation (93000)   Impression & Recommendations:  Problem # 1:  CORONARY ATHEROSCLEROSIS OF ARTERY BYPASS GRAFT (ICD-414.04) [name] headache stress test in December 2009 that showed a very small region of perfusion abnormality concerning for attenuation artifact. He is symptom-free but does have rare episodes of chest pain that seem relatively atypical. As he is not having any further symptoms, we will continue medical management at this time. If he  has additional episodes of chest pain, we would repeat a stress test or perform a cardiac catheterization. we did suggest that he increase his aspirin to 2 baby aspirins a day. His updated medication list for this problem includes:    Metoprolol Tartrate 25 Mg Tabs (Metoprolol tartrate) .Marland Kitchen... Take one tablet by mouth twice a day    Lisinopril 10 Mg Tabs (Lisinopril) .Marland Kitchen... Take one tablet by mouth daily    Aspirin 81 Mg Tbec (Aspirin) .Marland Kitchen... Take 2 tablets by mouth daily  Orders: EKG w/ Interpretation (93000)  Problem # 2:  HYPERLIPIDEMIA (ICD-272.4) lipid panel was last performed in 2010 and LDL at that time was 67, total cholesterol 145 and HDL 44. We will continue him on his current dose of simvastatin 40 mg daily with his Niaspan 1000 mg daily. We will recheck his lipids in 6 months time. His updated medication list for this problem includes:    Simvastatin 40 Mg Tabs (Simvastatin) .Marland Kitchen... Take one tablet by mouth daily at bedtime    Niacor 500 Mg Tabs (Niacin) .Marland Kitchen... 2 by mouth two times a day  Problem # 3:  HYPERTENSION, BENIGN (ICD-401.1) blood pressure is well controlled on his current medication regimen of lisinopril 10 mg daily and metoprolol for treatment 25 mg b.i.d. No changes made. His updated medication list for this problem includes:    Metoprolol Tartrate 25 Mg Tabs (Metoprolol tartrate) .Marland Kitchen... Take one tablet by mouth twice a day    Lisinopril 10 Mg Tabs (Lisinopril) .Marland Kitchen... Take one tablet by mouth daily    Aspirin 81 Mg Tbec (Aspirin) .Marland Kitchen... Take 2 tablets by mouth daily  Patient Instructions: 1)  Your physician recommends that you schedule a follow-up appointment in: 6 months 2)  Your physician recommends that you continue on your current medications as directed. Please refer to the Current Medication list given to you today. Prescriptions: METOPROLOL TARTRATE 25 MG TABS (METOPROLOL TARTRATE) Take one tablet by mouth twice a day  #60 x 6   Entered by:   Charlena Cross, RN,  BSN   Authorized by:   Dossie Arbour MD   Signed by:   Charlena Cross, RN, BSN on 03/03/2009   Method used:   Electronically to        [facility]* (retail)       [address]       [place], [place]  [address]       Ph: [phone]       Fax: [phone]   RxID:   [id number]

## 2010-03-01 NOTE — Progress Notes (Signed)
Summary: refill request  Phone Note From Pharmacy   Caller: [facility]* Summary of Call: pharmacy calling for refill of lisinopril 20mg , pt req 90 day supply fax [phone] Initial call taken by: Glynda Jaeger,  January 04, 2010 9:57 AM    Prescriptions: LISINOPRIL 20 MG TABS (LISINOPRIL) Take one tablet by mouth daily  #90 x 3   Entered by:   Kem Parkinson   Authorized by:   Marca Ancona, MD   Signed by:   Kem Parkinson on 01/05/2010   Method used:   Electronically to        [facility]* (retail)       [address]       [place], [place]  [address]       Ph: [phone]       Fax: [phone]   RxID:   [id number]

## 2010-03-03 NOTE — Assessment & Plan Note (Signed)
Summary: F/U 6 MONTHS   Visit Type:  Follow-up Primary Provider:  Dr. Jeannetta Nap  CC:  Denies chest pain or shortness of breath.  He does have some pain in left leg from hitting it on the trailer hitch..  History of Present Illness: [name] is a 70 yo, with a h/o  coronary artery disease, HTN, bypass surgery in August of 2007 with a LIMA  to the LAD, vein graft to the diagonal, and graft to the OM and PDA of the circumflex, as well as history of hypertension and hyperlipidemia.  He presents for routine follow up.  He has been very active, remodeling a house. Recently hurt his left knee with significant swelling. Otherwise feels good, no chest pain, no shortness of breath, no other symptoms.  EKG: NSR with rate of 79 bpm, nonspecific ST changes in inferior leads.  Current Medications (verified): 1)  Metoprolol Tartrate 25 Mg Tabs (Metoprolol Tartrate) .... Take One Tablet By Mouth Twice A Day 2)  Lisinopril 20 Mg Tabs (Lisinopril) .... Take One Tablet By Mouth Daily 3)  Simvastatin 40 Mg Tabs (Simvastatin) .... Take One Tablet By Mouth Daily At Bedtime 4)  Niacor 500 Mg Tabs (Niacin) .... 2 By Mouth Two Times A Day 5)  One-A-Day Extras Antioxidant  Caps (Multiple Vitamins-Minerals) .Marland Kitchen.. 1 By Mouth Once Daily 6)  Aspirin 81 Mg Tbec (Aspirin) .... Take 2 Tablets By Mouth Daily 7)  Meclizine Hcl 25 Mg Tabs (Meclizine Hcl) .... Take 1 By Mouth Three Times A Day As Needed For Dizziness 8)  Amlodipine Besylate 10 Mg Tabs (Amlodipine Besylate) .... Take One Tablet By Mouth Daily  Allergies (verified): 1)  ! Codeine  Past History:  Past Medical History: Last updated: 05/14/2009 1. Arthritis 2. CAD:  CABG 2007 with LIMA-LAD,  SVG-D, sequential SVG-OM and CFX PDA.  Myoview 12/09 was probably a negative study with attenuation artifact.  3. Hyperlipidemia 4. Hypertension 5. Positional vertigo  Past Surgical History: Last updated: 03/09/2009 CABG Bypass surgery August 2007  Family  History: Last updated: 2009/03/07 Father: deceased 11 : alzeimer's Mother: deceased 21: old age Sister deceased 49: DM  lots of health problems  Social History: Last updated: Mar 07, 2009 Retired  Married  Tobacco Use - Former.  Alcohol Use - no Regular Exercise - yes Drug Use - no  Risk Factors: Alcohol Use: 0 (03-07-09) Caffeine Use: no (2009-03-07) Exercise: yes (03/07/09)  Risk Factors: Smoking Status: quit (03/07/2009)  Review of Systems  The patient denies fever, weight loss, weight gain, vision loss, decreased hearing, hoarseness, chest pain, syncope, dyspnea on exertion, peripheral edema, prolonged cough, abdominal pain, incontinence, muscle weakness, depression, and enlarged lymph nodes.         Left knee swelling and pain  Vital Signs:  Patient profile:   70 year old male Height:      72.5 inches Weight:      221 pounds BMI:     29.67 Pulse rate:   79 / minute BP sitting:   144 / 80  (left arm) Cuff size:   regular  Vitals Entered By: Bishop Dublin, CMA (February 10, 2010 2:35 PM)  Physical Exam  General:  Well developed, well nourished, in no acute distress. Head:  normocephalic and atraumatic Neck:  Neck supple, no JVD. No masses, thyromegaly or abnormal cervical nodes. Lungs:  Clear bilaterally to auscultation and percussion. Heart:  Non-displaced PMI, chest non-tender; regular rate and rhythm, S1, S2 without murmurs, rubs or gallops. Carotid upstroke normal, no  bruit.  Pedals normal pulses. Trace LE  edema b/l, no varicosities. Abdomen:  Bowel sounds positive; abdomen soft and non-tender without masses Msk:  Back normal, normal gait. Muscle strength and tone normal. Pulses:  pulses normal in all 4 extremities Extremities:  No clubbing or cyanosis. Neurologic:  Alert and oriented x 3. Skin:  Intact without lesions or rashes. Psych:  Normal affect.   Impression & Recommendations:  Problem # 1:  CORONARY ATHEROSCLEROSIS OF ARTERY BYPASS GRAFT  (ICD-414.04) no symptoms of angina. No further testing. Continue current medications. His updated medication list for this problem includes:    Metoprolol Tartrate 25 Mg Tabs (Metoprolol tartrate) .Marland Kitchen... Take one tablet by mouth twice a day    Lisinopril 20 Mg Tabs (Lisinopril) .Marland Kitchen... Take one tablet by mouth daily    Aspirin 81 Mg Tbec (Aspirin) .Marland Kitchen... Take 2 tablets by mouth daily    Amlodipine Besylate 10 Mg Tabs (Amlodipine besylate) .Marland Kitchen... Take one tablet by mouth daily  Orders: EKG w/ Interpretation (93000)  Problem # 2:  HYPERLIPIDEMIA (ICD-272.4) cholesterol is at goal. Continue him on his current medication.  His updated medication list for this problem includes:    Simvastatin 40 Mg Tabs (Simvastatin) .Marland Kitchen... Take one tablet by mouth daily at bedtime    Niacor 500 Mg Tabs (Niacin) .Marland Kitchen... 2 by mouth two times a day  Problem # 3:  HYPERTENSION, BENIGN (ICD-401.1) Blood pressure well controlled on his current medications. No medication changes made.  His updated medication list for this problem includes:    Metoprolol Tartrate 25 Mg Tabs (Metoprolol tartrate) .Marland Kitchen... Take one tablet by mouth twice a day    Lisinopril 20 Mg Tabs (Lisinopril) .Marland Kitchen... Take one tablet by mouth daily    Aspirin 81 Mg Tbec (Aspirin) .Marland Kitchen... Take 2 tablets by mouth daily    Amlodipine Besylate 10 Mg Tabs (Amlodipine besylate) .Marland Kitchen... Take one tablet by mouth daily  Patient Instructions: 1)  Your physician recommends that you schedule a follow-up appointment in: 1 year 2)  Your physician recommends that you continue on your current medications as directed. Please refer to the Current Medication list given to you today. Prescriptions: AMLODIPINE BESYLATE 10 MG TABS (AMLODIPINE BESYLATE) Take one tablet by mouth daily  #90 x 3   Entered by:   Lanny Hurst RN   Authorized by:   Dossie Arbour MD   Signed by:   Lanny Hurst RN on 02/10/2010   Method used:   Electronically to        [facility]* (retail)       [address]       [place], [place]  [address]       Ph: [phone]       Fax: [phone]   RxID:   [id number]

## 2010-04-20 LAB — BASIC METABOLIC PANEL
BUN: 11 mg/dL (ref 6–23)
CO2: 24 mEq/L (ref 19–32)
Chloride: 108 mEq/L (ref 96–112)
Creatinine, Ser: 0.8 mg/dL (ref 0.4–1.5)
Glucose, Bld: 106 mg/dL — ABNORMAL HIGH (ref 70–99)
Potassium: 4.1 mEq/L (ref 3.5–5.1)

## 2010-04-20 LAB — DIFFERENTIAL
Basophils Absolute: 0 10*3/uL (ref 0.0–0.1)
Basophils Relative: 0 % (ref 0–1)
Eosinophils Absolute: 0 10*3/uL (ref 0.0–0.7)
Monocytes Absolute: 0.6 10*3/uL (ref 0.1–1.0)
Neutro Abs: 6.7 10*3/uL (ref 1.7–7.7)
Neutrophils Relative %: 79 % — ABNORMAL HIGH (ref 43–77)

## 2010-04-20 LAB — URINALYSIS, ROUTINE W REFLEX MICROSCOPIC
Bilirubin Urine: NEGATIVE
Glucose, UA: NEGATIVE mg/dL
Hgb urine dipstick: NEGATIVE
Ketones, ur: NEGATIVE mg/dL
Protein, ur: NEGATIVE mg/dL
pH: 7.5 (ref 5.0–8.0)

## 2010-04-20 LAB — CBC
Hemoglobin: 14.9 g/dL (ref 13.0–17.0)
MCHC: 33.7 g/dL (ref 30.0–36.0)
MCV: 96 fL (ref 78.0–100.0)
RDW: 13.3 % (ref 11.5–15.5)

## 2010-05-30 ENCOUNTER — Other Ambulatory Visit: Payer: Self-pay | Admitting: Emergency Medicine

## 2010-05-30 MED ORDER — NIACIN 500 MG PO TABS: 1000.0000 mg | ORAL_TABLET | Freq: Two times a day (BID) | ORAL | Status: DC

## 2010-06-17 NOTE — Cardiovascular Report (Signed)
NAMEYONI, [name] NO.:  [id number]   MEDICAL RECORD NO.:  [id number]          PATIENT TYPE:  INP   LOCATION:  2904                         FACILITY:  [facility]   PHYSICIAN:  Nanetta Batty, M.D.   DATE OF BIRTH:  03-01-1940   DATE OF PROCEDURE:  09/29/2005  DATE OF DISCHARGE:                              CARDIAC CATHETERIZATION   [name] is a 70 year old gentleman patient of Dr. Mindi Junker Little's and Dr.  Windle Guard'.  He has a history hypertension, hyperlipidemia and  borderline diabetes.  He was awakened with chest pain last night lasting a  half an hour.  He saw Dr. Jeannetta Nap in the office today who referred him over  to [facility] for further evaluation.  He has been pain free since last night.  His EKG does show anterior T-wave inversion with mild inferior ST-segment  elevation.  His enzymes were mildly elevated.   DESCRIPTION OF PROCEDURE:  The patient was brought to the second floor [facility] cardiac cath lab in the postabsorptive state.  He was premedicated with  p.o. Valium.   His right groin was prepped and shaved in the usual sterile fashion. One  percent Xylocaine was used for local anesthesia.  A 6-French sheath was  inserted into the right femoral artery using standard Seldinger technique.  A 6-French right and left Judkins diagnostic catheter as well as a 6-French  pigtail catheter were used for selective coronary angiography, left  ventriculography, subselective left internal mammary artery angiography, and  distal abdominal aortography.  Visipaque dye was used for the entirety of  the case. Retrograde aortic, ventricular and pullback pressures were  recorded.   HEMODYNAMIC RESULTS:  1. Aortic systolic pressure 132, diastolic pressure 76.  2. Left ventricular systolic pressure 137, end-diastolic pressure 19.   SELECTIVE CORONARY ANGIOGRAPHY.:  1. Left main normal.  2. LAD; the LAD had a 99% stenosis in the mid portion just proximal to the  bifurcation of a large diagonal branch.  It also involved a large      septal perforator.  There was 60% segmental stenosis just prior to      this.  The diagonal branch had 80% ostial stenosis and 60%  segmental      proximal stenosis.  3. Left circumflex; this was a dominant vessel which gave off a large      first marginal branch that appeared to have at least a 90% ostial      stenosis.  4. Ramus branch; a small vessel that was free of disease.  5. Right coronary artery; nondominant vessel that was occluded in its mid      portion with bridging collaterals.  6. Left ventriculography; RAO left ventriculogram was performed using 25      mL of Visipaque dye at 12 mL per second.  The overall LVEF was      estimated at greater than 60% without focal wall motion abnormalities.  7. Left internal mammary artery; this vessel was subselectively visualized      and was widely patent.  It was suitable for use in coronary  artery      bypass grafting.  There was approximately 40% proximal left subclavian      artery stenosis that did not appear to be hemodynamically significant.  8. Distal abdominal aortography; performed using 25 mL of Visipaque dye at      20 mL per second.  The renal artery is widely patent. The infrarenal      abdominal aorta and iliac bifurcation were free of systemic and      atherosclerotic changes.   IMPRESSION AND PLAN:  [name] has high-grade mid LAD diagonal bifurcation  disease.  I believe this is high risk for percutaneous intervention.  In  addition to his circ obtuse marginal branch stenosis, I feel the best option  for him would be to undergo coronary artery bypass grafting with a LIMA to  his LAD diag and vein graft to his OM.  He is hemodynamically stable at this  time and is pain free.  I have called [facility] and Dr. Donata Clay will review the  films with me with regard to timing of his surgical revascularization.   The diagnostic catheter is removed.  The sheath  was sewn securely in place.  The patient will remain on the cath table until his films are reviewed.      Nanetta Batty, M.D.  Electronically Signed     JB/MEDQ  D:  09/29/2005  T:  09/29/2005  Job:  [id number]   cc:   [facility]  Windle Guard, M.D.

## 2010-06-17 NOTE — Discharge Summary (Signed)
[name], [name] NO.:  [id number]   MEDICAL RECORD NO.:  [id number]          PATIENT TYPE:  INP   LOCATION:  2012                         FACILITY:  [facility]   PHYSICIAN:  Theda Belfast, PA DATE OF BIRTH:  05-Sep-1940   DATE OF ADMISSION:  09/28/2005  DATE OF DISCHARGE:                                 DISCHARGE SUMMARY   PRIMARY DIAGNOSIS:  1. Critical coronary anatomy with unstable angina requiring emergency      coronary artery bypass grafting.   IN HOSPITAL DIAGNOSES:  1. Postoperative atrial fibrillation.   SECONDARY DIAGNOSES:  1. Hypertension.  2. History nephrolithiasis.  3. Benign prostatic hypertrophy.   OPERATIONS AND PROCEDURES:  1. Cardiac catheterization.  2. Coronary artery bypass grafting x4 using a left internal mammary to      left anterior descending coronary artery, reverse saphenous vein graft      to diagonal coronary artery, sequential reverse saphenous vein graft to      first obtuse marginal posterior descending coronary arising off the      distal circumflex with endo vein harvesting.   PATIENT'S HISTORY AND PHYSICAL AND HOSPITAL COURSE:  The patient is 70-year-  old male who was admitted with progressive chest pain symptoms, slight  elevation of troponin.  He underwent urgent cardiac catheterization by Dr.  Allyson Sabal that revealed critical coronary anatomy particular involving the LAD  and diagonal.  Because of the critical anatomy and his ongoing chest pain  emergency coronary artery bypass was recommended.  The patient was seen and  evaluated by Dr. Tyrone Sage.  Dr. Tyrone Sage discussed with the patient  undergoing coronary bypass grafting.  He discussed risks and benefits.  The  patient acknowledged understanding and agreed to proceed.  The plan was to  take the patient urgently to the OR on September 29, 2005 following  catheterization.  For details of the patient's past medical history and  physical exam please see dictated  history and physical.  Prior to undergoing  surgery the patient had bilateral carotid duplex ultrasound done which  showed no evidence of significant ICA stenosis.   The patient was taken to the operating room August 31st 2007 where he  underwent coronary bypass grafting x4 using a left internal mammary artery  to left anterior descending coronary artery, reverse saphenous vein graft to  diagonal coronary artery, sequential reverse saphenous vein graft to first  obtuse marginal posterior descending coronary artery arising off the distal  circumflex with endo vein harvesting.  The patient tolerated this procedure  and was transferred up to the intensive care unit in stable condition.  This  patient was extubated the evening of my early morning following surgery.  Following extubation the patient was seen to be alert and oriented x3.  Neuro intact.  Following surgery the patient was seen to be hemodynamically  stable with hematocrit 36%.  The patient's postoperative course, was  complicated by postoperative atrial fibrillation which he developed the  evening of postop day #1.  The patient was started on IV amiodarone at that  time.  Following initiation of amiodarone  the patient converted back to  normal sinus rhythm.  The patient was then switched to amiodarone p.o.  following conversion.  He did develop another episode postop day #2 when he  was converted back on Cardizem drip.  Following conversion Cardizem drip was  stopped.  The remainder of the patient's postoperative course he remained in  normal sinus rhythm.  Plan will be to continue the patient on p.o.  amiodarone.   Remainder of the patient's postoperative course was pretty much  unremarkable.  Chest tubes and lines were D&C'd  in the normal fashion.  The  patient was out of bed ambulating well.  He remained hemodynamically stable  postoperatively.  Last hemoglobin, hematocrit was 9.7 and 28.7.  The patient  was transferred up  to 2000 postop day #3.  His incisions were clean, dry and  intact and healing well.  He was out of bed ambulating well.  Vital signs  were monitored and seemed to be stable.  The patient was afebrile  postoperatively.  With encouragement his incentive spirometer the patient  was able to be weaned off oxygen satting greater than 90% on room air.  The  patient was seen to have developed volume overload postoperatively.  He is  back near his preoperative weight at 99.9 kg.  Day four was preoperative  weight 94.30 kg.  The patient was tolerating regular diet well.  No nausea,  vomiting noted.  Bowel movements within normal limits.   The patient is tentatively ready for discharge home postop day #5, October 04, 2005.  Follow-up appointment will be arranged with Dr. Tyrone Sage for in 3  weeks.  The patient will need to obtain a PMI chest x-ray 1 hour prior to  this point.  The patient will need to contact Dr. Hazle Coca office to schedule  follow-up appointment him in 2 weeks.   ACTIVITY:  Patient instructed no driving __________ , no lifting over 10  pounds.  The patient is told to ambulate 34 times per day, progress as  tolerated, continue his breathing exercises.   INCISIONAL CARE:  The patient may shower washing his incisions using soap  and water.  He is to contact the office if he develops any drainage or  opening from any of his incision sites.   DIET:  The patient was educated on diet, low fat, low salt. He acknowledged  understanding.   DISCHARGE MEDICATIONS:  1. Aspirin 325 mg daily.  2. Lopressor 25 mg b.i.d.  3. Altace 2.5 mg at night.  4. Zocor 20 mg at night.  5. Amiodarone 400 mg b.i.d. x3 days then 200 mg b.i.d.  6. Oxycodone 5 mg 1-2 tablets q. 4 to 6 hours p.r.n. pain.      Theda Belfast, PA     KMD/MEDQ  D:  10/03/2005  T:  10/03/2005  Job:  [id number]   cc:   Nanetta Batty, M.D.

## 2010-06-17 NOTE — Op Note (Signed)
NAMEOLLIVANDER, SEE NO.:  [id number]   MEDICAL RECORD NO.:  [id number]          PATIENT TYPE:  INP   LOCATION:  2311                         FACILITY:  [facility]   PHYSICIAN:  Sheliah Plane, MD    DATE OF BIRTH:  08/07/1940   DATE OF PROCEDURE:  09/29/2005  DATE OF DISCHARGE:                                 OPERATIVE REPORT   PREOPERATIVE DIAGNOSIS:  Critical coronary anatomy with unstable angina  requiring emergency coronary artery bypass grafting.   POSTOPERATIVE DIAGNOSIS:  Critical coronary anatomy with unstable angina  requiring emergency coronary artery bypass grafting.   OPERATION/PROCEDURE:  Coronary artery bypass grafting x4 with the left  internal mammary to the left anterior descending coronary artery, reverse  saphenous vein graft to diagonal coronary artery, sequential reverse  saphenous vein graft to the first obtuse marginal posterior descending  coronary arising off the distal circumflex with endovein harvesting.   SURGEON:  Gwenith Daily. Tyrone Sage, M.D.   FIRST ASSISTANT:  Evelene Croon, M.D.   SECOND ASSISTANT:  Jerold Coombe, P.A.-C.   BRIEF HISTORY:  The patient is a 70 year old male who was admitted with  progressive chest pain symptoms, slight elevation of troponins.  He  underwent urgent cardiac catheterization by Dr. Nanetta Batty that revealed  critical coronary anatomy particularly involving the LAD and diagonal.  Because of the critical anatomy, he has ongoing chest pain. Emergency  coronary artery bypass grafting was recommended. The patient was taken from  the cath lab to the operating room for preparation for surgery.   DESCRIPTION OF PROCEDURE:  With Swan-Ganz and arterial line monitors in  place, the patient underwent general endotracheal anesthesia without  incident.  The skin of the chest and legs was prepped with Betadine and  draped in the usual sterile manner.  Using the Guidant endovein harvesting  system, vein was  harvested from the right thigh and right calf both of  adequate quality and caliber.  Median sternotomy was performed and left  internal mammary artery was dissected down as pedicle graft.  The distal  artery was divided and had good free flow.  Pericardium was opened.  Overall  ventricular function appeared preserved.  The patient was systemically  heparinized.  Ascending aorta and the right atrium were cannulated and the  aortic root vent cardioplegia needle was introduced into the ascending  aorta.   The patient was placed on cardiopulmonary bypass, 2.4 L/per minute/sq m and  sites for anastomosis were selected and dissected out of the epicardium.  The patient's body temperature was cooled to 30 degrees.  Aortic crossclamp  was applied and 500 mL of cold blood potassium cardioplegia was administered  with rapid diastolic arrest of the heart.  Myocardial septal temperature was  monitored throughout the crossclamp.   Attention was turned first to the OM-1 vessel which was opened and was  partially an intramyocardial vessel, admitting a 1.5 mm probe.  Using a  diamond-type, side-to-side anastomosis was carried out for the second  reverse saphenous vein graft.  Distal segment of the same vein was then  carried to the  more diffusely diseased small posterolateral branch which  arose from the distal circumflex.  Using running 7-0 Prolene distal  anastomosis was performed.   Attention was turned first to the diagonal coronary artery which was opened  and admitted a 1.5 mm probe.  Using a running 7-0 Prolene, distal  anastomosis was performed.   Attention was then turned to the left anterior descending coronary artery  which was opened between the mid and distal third of the vessel.  Using a  running 8-0 Prolene, the left internal mammary artery was anastomosed to the  left anterior descending coronary artery.  With release of the Edwards  bulldog on the mammary artery, there was  appropriate rise in myocardial  septal temperature.  With aortic crossclamp still in place, two punch  aortotomies were performed and each of two vein grafts were anastomosis to  the ascending aorta.  Air was evacuated from the graft a partial occlusion  clamp was removed.  Sites of anastomoses were inspected and free of  bleeding.  There was a proper rise in myocardial septal temperature with  removal of the bulldog on the mammary artery.  The patient spontaneously  converted to a sinus rhythm.  Sites of anastomoses were inspected and free  of bleeding.  The patient was then ventilated and weaned from  cardiopulmonary bypass without difficulty.  He remained hemodynamically  stable.  He was decannulated in the usual fashion.  Protamine sulfate was  administered.  With the operative field hemostatic, two atrial and two  ventricular pacing wires were applied.  Graft markers applied.  A left  pleural tube and a single Blake drain mediastinal drain were left in place.  Pericardium was loosely reapproximated.  Sternum was closed with #6  stainless steel wire, fascia closed with interrupted 0 Vicryl, running 3-0  Vicryl and subcutaneous tissue with 4-0 subcuticular stitch in skin edges.  Dry dressings were applied.  Sponge and needle count was reported as correct  to completion of the procedure.  The patient tolerated the procedure without  obvious complications and was transferred to the surgical intensive care  unit for further postoperative care.  Total crossclamp time was 75 minutes.  Total pump time 112 minutes.      Sheliah Plane, MD  Electronically Signed     EG/MEDQ  D:  10/01/2005  T:  10/01/2005  Job:  [id number]   cc:   Nanetta Batty, M.D.

## 2010-06-17 NOTE — Consult Note (Signed)
NAMEABDALRAHMAN, CLEMENTSON NO.:  [id number]   MEDICAL RECORD NO.:  [id number]          PATIENT TYPE:  INP   LOCATION:  2904                         FACILITY:  [facility]   PHYSICIAN:  Kerin Perna, M.D.  DATE OF BIRTH:  October 16, 1940   DATE OF CONSULTATION:  09/29/2005  DATE OF DISCHARGE:                                   CONSULTATION   REFERRING PHYSICIAN:  Nanetta Batty, M.D.   PRIMARY CARE PHYSICIAN:  Windle Guard, M.D.   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   CHIEF COMPLAINT:  Chest pain.   HISTORY OF PRESENT ILLNESS:  I was asked to evaluate this 70 year old white  male hypertensive patient for potential urgent surgical coronary  revascularization for recently diagnosed severe coronary artery disease.  The patient was admitted through the emergency department last night with  nocturnal substernal chest pressure which radiated to his shoulders and  lasted over 20 minutes.  There is no associated nausea, diaphoresis, or  shortness of breath.  He took ibuprofen and then aspirin, and had some  recurrent pain, so he presented to the emergency department.  His EKG in the  emergency department showed ST segment changes and his cardiac enzymes were  mildly elevated with a troponin of 0.5 and CPK-MB of 9 nanogram/mL.  He was  placed on heparin, nitroglycerin, and scheduled for cardiac catheterization  today.  This was performed by Dr. Allyson Sabal which demonstrated a 90% LAD  diagonal stenosis which is complex.  He had a left dominant system with an  OM1 with 70% stenosis and a posterior descending off the distal circumflex  of 50-70%.  His LVEF was normal.  There is no evidence of valvular  insufficiency.  Because of his coronary anatomy, it is felt that surgical  revascularization should be performed today for this gentleman.   PAST MEDICAL HISTORY:  1. Hypertension.  2. History of kidney stones and benign prostatic  hypertrophy followed by      Courtney Paris, M.D.  3. Right shoulder arthroscopic surgery last month.   ALLERGIES:  No known drug allergies. The patient is intolerant to LIPITOR  because of myalgias and CLONIDINE causes severe constipation.   HOME MEDICATIONS:  1. Atacand hydrocortisone 32/12.5 daily.  2. Aspirin 81 mg daily.  3. Multivitamin.   SOCIAL HISTORY:  The patient is married with two children.  Two prior  children have died.  He does not smoke or use alcohol and has a very active  lifestyle doing wood-working and yard work, and building projects.   FAMILY HISTORY:  Positive for diabetes, positive for myocardial infarction,  and Alzheimer's.  One sister had a CABG.   REVIEW OF SYSTEMS:  CONSTITUTIONAL:  Negative for fever or weight loss.  ENT:  Negative for active dental problems or difficulty swallowing.  THORACIC:  Negative for chest trauma or prior history of abnormal chest x-  ray with pulmonary nodule.  HEART:  Positive for severe coronary artery  disease, negative for prior angina, MI, or cardiac murmur, or arrhythmia.  GASTROINTESTINAL:  Negative  for hepatitis, jaundice, or blood per rectum.  NEPHROLOGY:  Positive for kidney stones.  ENDOCRINE:  Negative for diabetes  or thyroid disease.  VASCULAR:  Negative for DVT, claudication, or TIA.  NEUROLOGY:  Negative for stroke or seizure.   PHYSICAL EXAMINATION:  VITAL SIGNS:  The patient is 6 feet 2 inches and  weighs 208 pounds.  Blood pressure is 138/90, pulse 80 and sinus rhythm,  respirations 18, temperature 97.0.  GENERAL:  A pleasant white male in no distress.  He is in catheterization  lab following cardiac catheterization.  HEENT:  Normocephalic, full EOM's.  Dentition is adequate.  NECK:  Without JVD, mass, or carotid bruit.  LYMPHATICS:  No palpable cervical or supraclavicular adenopathy.  LUNGS:  His breath sounds are clear and there is no thoracic deformity.  HEART:  Regular without S3 gallop or  murmur.  ABDOMEN:  Soft without pulsatile mass or organomegaly.  EXTREMITIES:  No cyanosis, clubbing, or edema.  Peripheral pulses are 2+ and  strong in all extremities.  NEUROLOGY:  Intact while he is supine.   LABORATORY DATA:  I reviewed the coronary arteriograms with Dr. Allyson Sabal and he  has severe LAD/diagonal stenosis with moderate disease of the dominant  circumflex vessels.  His creatinine is 1.0.  His glucose is 120 and his  hematocrit 44 with a white count of 8000, platelet count 280,000.  Coagulation parameters are normal.   IMPRESSION:  The patient will benefit from surgical revascularization which  will be scheduled today by Sheliah Plane, M.D.  I have discussed the  procedure with the patient and family including indications, alternatives,  and associated risks.  They agree and the surgery will be scheduled early  this afternoon.      Kerin Perna, M.D.  Electronically Signed     PV/MEDQ  D:  09/29/2005  T:  09/29/2005  Job:  [id number]   cc:   [facility]  Windle Guard, M.D.

## 2010-08-16 ENCOUNTER — Encounter: Payer: Self-pay | Admitting: Cardiovascular Disease

## 2010-10-19 LAB — POCT CARDIAC MARKERS: CKMB, poc: 1.1

## 2010-10-19 LAB — I-STAT 8, (EC8 V) (CONVERTED LAB)
BUN: 19
Glucose, Bld: 105 — ABNORMAL HIGH
Hemoglobin: 16
Potassium: 4
TCO2: 26
pH, Ven: 7.358 — ABNORMAL HIGH

## 2010-10-19 LAB — POCT I-STAT CREATININE
Creatinine, Ser: 1.1
Operator id: 151321

## 2011-02-03 ENCOUNTER — Ambulatory Visit: Payer: Self-pay | Admitting: Cardiovascular Disease

## 2011-02-20 ENCOUNTER — Encounter: Payer: Self-pay | Admitting: Cardiovascular Disease

## 2011-02-20 ENCOUNTER — Ambulatory Visit (INDEPENDENT_AMBULATORY_CARE_PROVIDER_SITE_OTHER): Payer: Medicare Other | Admitting: Cardiovascular Disease

## 2011-02-20 VITALS — BP 110/62 | HR 62 | Resp 16 | Ht 74.0 in | Wt 222.0 lb

## 2011-02-20 DIAGNOSIS — I2510 Atherosclerotic heart disease of native coronary artery without angina pectoris: Secondary | ICD-10-CM

## 2011-02-20 DIAGNOSIS — I25810 Atherosclerosis of coronary artery bypass graft(s) without angina pectoris: Secondary | ICD-10-CM

## 2011-02-20 DIAGNOSIS — I10 Essential (primary) hypertension: Secondary | ICD-10-CM

## 2011-02-20 DIAGNOSIS — E785 Hyperlipidemia, unspecified: Secondary | ICD-10-CM

## 2011-02-20 NOTE — Assessment & Plan Note (Signed)
Blood pressure is well controlled on today's visit. No changes made to the medications. 

## 2011-02-20 NOTE — Progress Notes (Signed)
Patient ID: [name], male    DOB: 03/01/1940, 71 y.o.   MRN: [id number]  HPI Comments: 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  He presents for routine follow up.   He has been very active, remodeling and building storage sheds.  feels good, no chest pain, no shortness of breath, no other symptoms. He does not smoke though he does have secondhand exposure as his wife continues to smoke.   EKG: NSR with rate of 62 bpm, nonspecific ST changes in inferior leads.    Outpatient Encounter Prescriptions as of 02/20/2011  Medication Sig Dispense Refill  . amLODipine (NORVASC) 10 MG tablet Take 10 mg by mouth daily.        Marland Kitchen aspirin 325 MG buffered tablet Take 325 mg by mouth 2 (two) times daily.      Marland Kitchen lisinopril (PRINIVIL,ZESTRIL) 20 MG tablet Take 20 mg by mouth daily.        . meclizine (ANTIVERT) 25 MG tablet Take 25 mg by mouth 3 (three) times daily as needed.        . metoprolol tartrate (LOPRESSOR) 25 MG tablet Take 25 mg by mouth 2 (two) times daily.        . Multiple Vitamins-Minerals (ONE-A-DAY EXTRAS ANTIOXIDANT) CAPS Take by mouth daily.        . niacin (NIACOR) 500 MG tablet Take 2 tablets (1,000 mg total) by mouth 2 (two) times daily with a meal.  360 tablet  3  . simvastatin (ZOCOR) 40 MG tablet Take 40 mg by mouth at bedtime.        Marland Kitchen DISCONTD: aspirin (ASPIR-81) 81 MG EC tablet Take 81 mg by mouth daily.          Review of Systems  Constitutional: Negative.   HENT: Negative.   Eyes: Negative.   Respiratory: Negative.   Cardiovascular: Negative.   Gastrointestinal: Negative.   Musculoskeletal: Negative.   Skin: Negative.   Neurological: Negative.   Hematological: Negative.   Psychiatric/Behavioral: Negative.   All other systems reviewed and are  negative.    BP 110/62  Pulse 62  Ht 6\' 2"  (1.88 m)  Wt 222 lb (100.699 kg)  BMI 28.50 kg/m2  Physical Exam  Nursing note and vitals reviewed. Constitutional: He is oriented to person, place, and time. He appears well-developed and well-nourished.  HENT:  Head: Normocephalic.  Nose: Nose normal.  Mouth/Throat: Oropharynx is clear and moist.  Eyes: Conjunctivae are normal. Pupils are equal, round, and reactive to light.  Neck: Normal range of motion. Neck supple. No JVD present.  Cardiovascular: Normal rate, regular rhythm, S1 normal, S2 normal, normal heart sounds and intact distal pulses.  Exam reveals no gallop and no friction rub.   No murmur heard. Pulmonary/Chest: Effort normal and breath sounds normal. No respiratory distress. He has no wheezes. He has no rales. He exhibits no tenderness.  Abdominal: Soft. Bowel sounds are normal. He exhibits no distension. There is no tenderness.  Musculoskeletal: Normal range of motion. He exhibits no edema and no tenderness.  Lymphadenopathy:    He has no cervical adenopathy.  Neurological: He is alert and oriented to person, place, and time. Coordination normal.  Skin: Skin is warm and dry. No rash  noted. No erythema.  Psychiatric: He has a normal mood and affect. His behavior is normal. Judgment and thought content normal.           Assessment and Plan

## 2011-02-20 NOTE — Patient Instructions (Signed)
You are doing well. No medication changes were made.  Please call us if you have new issues that need to be addressed before your next appt.  Your physician wants you to follow-up in: 6 months.  You will receive a reminder letter in the mail two months in advance. If you don't receive a letter, please call our office to schedule the follow-up appointment.   

## 2011-02-20 NOTE — Assessment & Plan Note (Signed)
Cholesterol is at goal on the current lipid regimen. No changes to the medications were made.  

## 2011-02-20 NOTE — Assessment & Plan Note (Signed)
Currently with no symptoms of angina. No further workup at this time. Continue current medication regimen. 

## 2011-02-21 LAB — HEPATIC FUNCTION PANEL
ALT: 25 IU/L (ref 0–44)
AST: 25 IU/L (ref 0–40)
Albumin: 4.7 g/dL (ref 3.5–4.8)
Alkaline Phosphatase: 66 IU/L (ref 25–160)
Bilirubin, Direct: 0.21 mg/dL (ref 0.00–0.40)
Total Bilirubin: 0.7 mg/dL (ref 0.0–1.2)

## 2011-02-21 LAB — LIPID PANEL
Cholesterol, Total: 138 mg/dL (ref 100–199)
LDL Calculated: 68 mg/dL (ref 0–99)
Triglycerides: 103 mg/dL (ref 0–149)

## 2011-02-27 ENCOUNTER — Telehealth: Payer: Self-pay

## 2011-02-27 MED ORDER — LISINOPRIL 20 MG PO TABS: 20.0000 mg | ORAL_TABLET | Freq: Every day | ORAL | Status: DC

## 2011-02-27 NOTE — Telephone Encounter (Signed)
Refill sent for lisinopril 20 mg take one tablet daily. 

## 2011-03-02 ENCOUNTER — Other Ambulatory Visit: Payer: Self-pay | Admitting: Cardiovascular Disease

## 2011-03-02 MED ORDER — NIACIN ER (ANTIHYPERLIPIDEMIC) 1000 MG PO TBCR: 1000.0000 mg | EXTENDED_RELEASE_TABLET | Freq: Every day | ORAL | Status: DC

## 2011-03-03 ENCOUNTER — Telehealth: Payer: Self-pay

## 2011-03-03 MED ORDER — AMLODIPINE BESYLATE 10 MG PO TABS: 10.0000 mg | ORAL_TABLET | Freq: Every day | ORAL | Status: DC

## 2011-03-03 NOTE — Telephone Encounter (Signed)
Notified pharmacy the niacin 1000 mg one tablet daily.

## 2011-03-03 NOTE — Telephone Encounter (Signed)
Refill sent for amlodipine.  

## 2011-03-17 ENCOUNTER — Telehealth: Payer: Self-pay

## 2011-03-17 MED ORDER — METOPROLOL TARTRATE 25 MG PO TABS: 25.0000 mg | ORAL_TABLET | Freq: Two times a day (BID) | ORAL | Status: DC

## 2011-03-17 NOTE — Telephone Encounter (Signed)
Refill sent for metoprolol tart 25 mg take one tablet two times a day.

## 2011-04-14 ENCOUNTER — Other Ambulatory Visit: Payer: Self-pay | Admitting: *Deleted

## 2011-04-14 MED ORDER — SIMVASTATIN 40 MG PO TABS: 40.0000 mg | ORAL_TABLET | Freq: Every day | ORAL | Status: DC

## 2011-08-21 ENCOUNTER — Ambulatory Visit (INDEPENDENT_AMBULATORY_CARE_PROVIDER_SITE_OTHER): Payer: Medicare Other | Admitting: Cardiovascular Disease

## 2011-08-21 ENCOUNTER — Encounter: Payer: Self-pay | Admitting: Cardiovascular Disease

## 2011-08-21 VITALS — BP 102/60 | HR 59 | Ht 74.0 in | Wt 227.0 lb

## 2011-08-21 DIAGNOSIS — R0602 Shortness of breath: Secondary | ICD-10-CM

## 2011-08-21 DIAGNOSIS — R079 Chest pain, unspecified: Secondary | ICD-10-CM

## 2011-08-21 DIAGNOSIS — I25810 Atherosclerosis of coronary artery bypass graft(s) without angina pectoris: Secondary | ICD-10-CM

## 2011-08-21 DIAGNOSIS — E785 Hyperlipidemia, unspecified: Secondary | ICD-10-CM

## 2011-08-21 DIAGNOSIS — I10 Essential (primary) hypertension: Secondary | ICD-10-CM

## 2011-08-21 NOTE — Assessment & Plan Note (Signed)
Cholesterol is at goal on the current lipid regimen. No changes to the medications were made.  

## 2011-08-21 NOTE — Progress Notes (Signed)
Patient ID: [name], male    DOB: January 20, 1941, 71 y.o.   MRN: [id number]  HPI Comments: 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  He presents for routine follow up.   He has been very active, currently working with heavy machinery. He also remodeled his house. feels good, no chest pain, no shortness of breath, no other symptoms. He does not smoke though he does have second-hand exposure as his wife continues to smoke.   EKG: NSR with rate of 59 bpm, no significant ST or T wave changes    Outpatient Encounter Prescriptions as of 08/21/2011  Medication Sig Dispense Refill  . amLODipine (NORVASC) 10 MG tablet Take 1 tablet (10 mg total) by mouth daily.  90 tablet  3  . aspirin 325 MG buffered tablet Take 325 mg by mouth 2 (two) times daily.      Marland Kitchen lisinopril (PRINIVIL,ZESTRIL) 20 MG tablet Take 1 tablet (20 mg total) by mouth daily.  90 tablet  3  . metoprolol tartrate (LOPRESSOR) 25 MG tablet Take 1 tablet (25 mg total) by mouth 2 (two) times daily.  180 tablet  3  . Multiple Vitamins-Minerals (ONE-A-DAY EXTRAS ANTIOXIDANT) CAPS Take by mouth daily.        . niacin (NIASPAN) 1000 MG CR tablet Take 1 tablet (1,000 mg total) by mouth at bedtime.  30 tablet  11  . simvastatin (ZOCOR) 40 MG tablet Take 1 tablet (40 mg total) by mouth at bedtime.  30 tablet  5  . DISCONTD: meclizine (ANTIVERT) 25 MG tablet Take 25 mg by mouth 3 (three) times daily as needed.           Review of Systems  Constitutional: Negative.   HENT: Negative.   Eyes: Negative.   Respiratory: Negative.   Cardiovascular: Negative.   Gastrointestinal: Negative.   Musculoskeletal: Negative.   Skin: Negative.   Neurological: Negative.   Hematological: Negative.   Psychiatric/Behavioral: Negative.     All other systems reviewed and are negative.    BP 102/60  Ht 6\' 2"  (1.88 m)  Wt 227 lb (102.967 kg)  BMI 29.15 kg/m2  Physical Exam  Nursing note and vitals reviewed. Constitutional: He is oriented to person, place, and time. He appears well-developed and well-nourished.  HENT:  Head: Normocephalic.  Nose: Nose normal.  Mouth/Throat: Oropharynx is clear and moist.  Eyes: Conjunctivae are normal. Pupils are equal, round, and reactive to light.  Neck: Normal range of motion. Neck supple. No JVD present.  Cardiovascular: Normal rate, regular rhythm, S1 normal, S2 normal, normal heart sounds and intact distal pulses.  Exam reveals no gallop and no friction rub.   No murmur heard. Pulmonary/Chest: Effort normal and breath sounds normal. No respiratory distress. He has no wheezes. He has no rales. He exhibits no tenderness.  Abdominal: Soft. Bowel sounds are normal. He exhibits no distension. There is no tenderness.  Musculoskeletal: Normal range of motion. He exhibits no edema and no tenderness.  Lymphadenopathy:    He has no cervical adenopathy.  Neurological: He is alert and oriented to person, place, and time. Coordination normal.  Skin: Skin is warm and dry. No rash noted. No erythema.  Psychiatric: He has a normal mood and affect.  His behavior is normal. Judgment and thought content normal.           Assessment and Plan

## 2011-08-21 NOTE — Assessment & Plan Note (Signed)
Currently with no symptoms of angina. No further workup at this time. Continue current medication regimen. 

## 2011-08-21 NOTE — Assessment & Plan Note (Signed)
Blood pressure is well controlled on today's visit. No changes made to the medications. 

## 2011-08-21 NOTE — Patient Instructions (Addendum)
You are doing well. No medication changes were made.  Monitor your blood pressure If it runs low, call the office. We would cut the amlodipine in 1/2  Please call us if you have new issues that need to be addressed before your next appt.  Your physician wants you to follow-up in: 6 months.  You will receive a reminder letter in the mail two months in advance. If you don't receive a letter, please call our office to schedule the follow-up appointment.

## 2011-10-18 ENCOUNTER — Other Ambulatory Visit: Payer: Self-pay | Admitting: *Deleted

## 2011-10-18 ENCOUNTER — Telehealth: Payer: Self-pay | Admitting: *Deleted

## 2011-10-18 MED ORDER — SIMVASTATIN 40 MG PO TABS: 40.0000 mg | ORAL_TABLET | Freq: Every day | ORAL | Status: DC

## 2011-10-18 NOTE — Telephone Encounter (Signed)
Patient wanted to know when he is due for recall colonoscopy. Per letter on 03/17/2008, he was due April 2012. He states he  will make his appointment for procedure when he comes with his daughter.

## 2011-10-18 NOTE — Telephone Encounter (Signed)
Refilled Simvastatin. 

## 2011-10-23 ENCOUNTER — Encounter: Payer: Self-pay | Admitting: Internal Medicine

## 2011-12-14 ENCOUNTER — Ambulatory Visit (AMBULATORY_SURGERY_CENTER): Payer: Medicare Other | Admitting: *Deleted

## 2011-12-14 VITALS — Ht 73.0 in | Wt 220.0 lb

## 2011-12-14 DIAGNOSIS — Z1211 Encounter for screening for malignant neoplasm of colon: Secondary | ICD-10-CM

## 2011-12-14 MED ORDER — MOVIPREP 100 G PO SOLR: ORAL | Status: DC

## 2011-12-20 ENCOUNTER — Ambulatory Visit: Payer: Medicare Other | Admitting: Internal Medicine

## 2011-12-22 ENCOUNTER — Encounter: Payer: Self-pay | Admitting: Internal Medicine

## 2011-12-22 ENCOUNTER — Ambulatory Visit (AMBULATORY_SURGERY_CENTER): Payer: Medicare Other | Admitting: Internal Medicine

## 2011-12-22 VITALS — BP 115/75 | HR 64 | Temp 97.6°F | Resp 20 | Ht 74.0 in | Wt 227.0 lb

## 2011-12-22 DIAGNOSIS — Z1211 Encounter for screening for malignant neoplasm of colon: Secondary | ICD-10-CM

## 2011-12-22 DIAGNOSIS — Z86010 Personal history of colonic polyps: Secondary | ICD-10-CM

## 2011-12-22 MED ORDER — SODIUM CHLORIDE 0.9 % IV SOLN: 500.0000 mL | INTRAVENOUS | Status: DC

## 2011-12-22 NOTE — Patient Instructions (Addendum)

## 2011-12-22 NOTE — Op Note (Signed)
[facility] [address] [place], [address]   COLONOSCOPY PROCEDURE REPORT  PATIENT: [name], [name]  MR#: [id number] BIRTHDATE: 12/11/1940 , 71  yrs. old GENDER: Male ENDOSCOPIST: Hart Carwin, MD REFERRED BY:  Windle Guard, M.D. PROCEDURE DATE:  12/22/2011 PROCEDURE:   Colonoscopy, surveillance ASA CLASS:   Class II INDICATIONS:Patient's personal history of colon polyps and conoscopy 1999, 2005- hyperplastic polyp. MEDICATIONS: MAC sedation, administered by CRNA and propofol (Diprivan) 150mg  IV  DESCRIPTION OF PROCEDURE:   After the risks and benefits and of the procedure were explained, informed consent was obtained.  A digital rectal exam revealed no abnormalities of the rectum.    The LB [id number]  endoscope was introduced through the anus and advanced to the cecum, which was identified by both the appendix and ileocecal valve .  The quality of the prep was good, using MoviPrep .  The instrument was then slowly withdrawn as the colon was fully examined.     COLON FINDINGS: Moderate diverticulosis was noted.     Retroflexed views revealed no abnormalities.     The scope was then withdrawn from the patient and the procedure completed.  COMPLICATIONS: There were no complications. ENDOSCOPIC IMPRESSION: Moderate diverticulosis was noted  RECOMMENDATIONS: High fiber diet   REPEAT EXAM: In 10 year(s)  for Colonoscopy.  cc:  _______________________________ eSignedHart Carwin, MD 12/22/2011 11:58 AM

## 2011-12-22 NOTE — Progress Notes (Signed)
Patient did not experience any of the following events: a burn prior to discharge; a fall within the facility; wrong site/side/patient/procedure/implant event; or a hospital transfer or hospital admission upon discharge from the facility. (G8907) Patient did not have preoperative order for IV antibiotic SSI prophylaxis. (G8918)  

## 2011-12-25 ENCOUNTER — Telehealth: Payer: Self-pay | Admitting: *Deleted

## 2011-12-25 NOTE — Telephone Encounter (Signed)
  Follow up Call-  Call back number 12/22/2011  Post procedure Call Back phone  # [phone]  Permission to leave phone message Yes     Patient questions:  Do you have a fever, pain , or abdominal swelling? no Pain Score  0 *  Have you tolerated food without any problems? yes  Have you been able to return to your normal activities? yes  Do you have any questions about your discharge instructions: Diet   no Medications  no Follow up visit  no  Do you have questions or concerns about your Care? no  Actions: * If pain score is 4 or above: No action needed, pain <4.

## 2012-01-22 ENCOUNTER — Other Ambulatory Visit: Payer: Self-pay | Admitting: *Deleted

## 2012-01-22 MED ORDER — SIMVASTATIN 40 MG PO TABS: 40.0000 mg | ORAL_TABLET | Freq: Every day | ORAL | Status: DC

## 2012-01-22 NOTE — Telephone Encounter (Signed)
Refilled Lisinopril. 

## 2012-01-23 ENCOUNTER — Other Ambulatory Visit: Payer: Self-pay

## 2012-01-23 MED ORDER — SIMVASTATIN 40 MG PO TABS: 40.0000 mg | ORAL_TABLET | Freq: Every day | ORAL | Status: DC

## 2012-01-23 NOTE — Telephone Encounter (Signed)
Refill sent for simvastatin 40 mg sent for 90 day supply to [facility].

## 2012-02-20 ENCOUNTER — Ambulatory Visit (INDEPENDENT_AMBULATORY_CARE_PROVIDER_SITE_OTHER): Payer: Medicare Other | Admitting: Cardiovascular Disease

## 2012-02-20 ENCOUNTER — Ambulatory Visit: Payer: Medicare Other | Admitting: Cardiovascular Disease

## 2012-02-20 ENCOUNTER — Encounter: Payer: Self-pay | Admitting: Cardiovascular Disease

## 2012-02-20 VITALS — BP 158/78 | HR 84 | Ht 72.0 in | Wt 225.0 lb

## 2012-02-20 DIAGNOSIS — I25810 Atherosclerosis of coronary artery bypass graft(s) without angina pectoris: Secondary | ICD-10-CM

## 2012-02-20 DIAGNOSIS — H8110 Benign paroxysmal vertigo, unspecified ear: Secondary | ICD-10-CM

## 2012-02-20 DIAGNOSIS — E785 Hyperlipidemia, unspecified: Secondary | ICD-10-CM

## 2012-02-20 DIAGNOSIS — I10 Essential (primary) hypertension: Secondary | ICD-10-CM

## 2012-02-20 MED ORDER — METOPROLOL TARTRATE 25 MG PO TABS: 25.0000 mg | ORAL_TABLET | Freq: Two times a day (BID) | ORAL | Status: DC

## 2012-02-20 MED ORDER — AMLODIPINE BESYLATE 10 MG PO TABS: 5.0000 mg | ORAL_TABLET | Freq: Every day | ORAL | Status: DC

## 2012-02-20 MED ORDER — LISINOPRIL 20 MG PO TABS: 20.0000 mg | ORAL_TABLET | Freq: Every day | ORAL | Status: DC

## 2012-02-20 MED ORDER — SIMVASTATIN 40 MG PO TABS: 40.0000 mg | ORAL_TABLET | Freq: Every day | ORAL | Status: DC

## 2012-02-20 NOTE — Assessment & Plan Note (Signed)
Occasional vertigo symptoms, very mild. Better with Dramamine.

## 2012-02-20 NOTE — Patient Instructions (Addendum)
You are doing well. No medication changes were made. Come in in the next week or two for blood work  Please call us if you have new issues that need to be addressed before your next appt.  Your physician wants you to follow-up in: 6 months.  You will receive a reminder letter in the mail two months in advance. If you don't receive a letter, please call our office to schedule the follow-up appointment.

## 2012-02-20 NOTE — Assessment & Plan Note (Signed)
Blood pressure high today but he has been in a rash. Blood pressure at home has been well-controlled. No changes to his medications at this time. We'll monitor his blood pressure.

## 2012-02-20 NOTE — Assessment & Plan Note (Signed)
Currently with no symptoms of angina. No further workup at this time. Continue current medication regimen. 

## 2012-02-20 NOTE — Assessment & Plan Note (Signed)
Cholesterol is at goal on the current lipid regimen. No changes to the medications were made.  

## 2012-02-20 NOTE — Progress Notes (Signed)
Patient ID: [name], male    DOB: Sep 28, 1940, 72 y.o.   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  He presents for routine follow up.   He has been very active, continues working with heavy machinery. Has been very active and has remodeled his house. He feels good, no chest pain, no shortness of breath, no other symptoms. He does not smoke though he does have second-hand exposure as his wife continues to smoke. Brother passed away 3 weeks ago from pneumonia.  He does report having occasional sticking of food in his throat. Recent colonoscopy, also wanted EGD but this was not done  Total cholesterol 138, HDL 49   EKG: NSR with rate of 84 bpm, no significant ST or T wave changes    Outpatient Encounter Prescriptions as of 02/20/2012  Medication Sig Dispense Refill  . amLODipine (NORVASC) 10 MG tablet Take 5 mg by mouth daily.      Marland Kitchen aspirin 325 MG buffered tablet Take 325 mg by mouth 2 (two) times daily.      Marland Kitchen lisinopril (PRINIVIL,ZESTRIL) 20 MG tablet Take 1 tablet (20 mg total) by mouth daily.  90 tablet  3  . metoprolol tartrate (LOPRESSOR) 25 MG tablet Take 1 tablet (25 mg total) by mouth 2 (two) times daily.  180 tablet  3  . Multiple Vitamins-Minerals (ONE-A-DAY EXTRAS ANTIOXIDANT) CAPS Take by mouth daily.        . niacin (NIASPAN) 1000 MG CR tablet Take 1 tablet (1,000 mg total) by mouth at bedtime.  30 tablet  11  . simvastatin (ZOCOR) 40 MG tablet Take 1 tablet (40 mg total) by mouth at bedtime.  90 tablet  3     Review of Systems  Constitutional: Negative.   HENT: Negative.   Eyes: Negative.   Respiratory: Negative.   Cardiovascular: Negative.   Gastrointestinal: Negative.   Musculoskeletal: Negative.   Skin: Negative.     Neurological: Negative.   Hematological: Negative.   Psychiatric/Behavioral: Negative.   All other systems reviewed and are negative.    BP 158/78  Pulse 84  Ht 6' (1.829 m)  Wt 225 lb (102.059 kg)  BMI 30.52 kg/m2  Physical Exam  Nursing note and vitals reviewed. Constitutional: He is oriented to person, place, and time. He appears well-developed and well-nourished.  HENT:  Head: Normocephalic.  Nose: Nose normal.  Mouth/Throat: Oropharynx is clear and moist.  Eyes: Conjunctivae normal are normal. Pupils are equal, round, and reactive to light.  Neck: Normal range of motion. Neck supple. No JVD present.  Cardiovascular: Normal rate, regular rhythm, S1 normal, S2 normal, normal heart sounds and intact distal pulses.  Exam reveals no gallop and no friction rub.   No murmur heard. Pulmonary/Chest: Effort normal and breath sounds normal. No respiratory distress. He has no wheezes. He has no rales. He exhibits no tenderness.  Abdominal: Soft. Bowel sounds are normal. He exhibits no distension. There is no tenderness.  Musculoskeletal: Normal range of motion. He exhibits no edema and no tenderness.  Lymphadenopathy:    He has no cervical adenopathy.  Neurological: He is alert and oriented to person, place, and time. Coordination normal.  Skin: Skin is warm and dry.  No rash noted. No erythema.  Psychiatric: He has a normal mood and affect. His behavior is normal. Judgment and thought content normal.           Assessment and Plan

## 2012-03-01 ENCOUNTER — Ambulatory Visit (INDEPENDENT_AMBULATORY_CARE_PROVIDER_SITE_OTHER): Payer: Medicare Other

## 2012-03-01 DIAGNOSIS — E785 Hyperlipidemia, unspecified: Secondary | ICD-10-CM

## 2012-03-01 DIAGNOSIS — I25810 Atherosclerosis of coronary artery bypass graft(s) without angina pectoris: Secondary | ICD-10-CM

## 2012-03-02 LAB — HEPATIC FUNCTION PANEL
ALT: 28 IU/L (ref 0–44)
Bilirubin, Direct: 0.15 mg/dL (ref 0.00–0.40)
Total Protein: 6.7 g/dL (ref 6.0–8.5)

## 2012-03-02 LAB — LIPID PANEL
Chol/HDL Ratio: 3 ratio units (ref 0.0–5.0)
HDL: 43 mg/dL (ref >39)
LDL Calculated: 67 mg/dL (ref 0–99)
Triglycerides: 100 mg/dL (ref 0–149)
VLDL Cholesterol Cal: 20 mg/dL (ref 5–40)

## 2012-03-18 ENCOUNTER — Other Ambulatory Visit: Payer: Self-pay

## 2012-03-18 MED ORDER — METOPROLOL TARTRATE 25 MG PO TABS: 25.0000 mg | ORAL_TABLET | Freq: Two times a day (BID) | ORAL | Status: DC

## 2012-03-18 NOTE — Telephone Encounter (Signed)
Refill sent for metoprolol.  

## 2012-03-28 ENCOUNTER — Other Ambulatory Visit: Payer: Self-pay | Admitting: *Deleted

## 2012-03-28 MED ORDER — NIACIN ER (ANTIHYPERLIPIDEMIC) 1000 MG PO TBCR: 1000.0000 mg | EXTENDED_RELEASE_TABLET | Freq: Every day | ORAL | Status: DC

## 2012-03-28 NOTE — Telephone Encounter (Signed)
Refilled Niacin #30 Refill#5 sent to [facility].

## 2012-05-20 ENCOUNTER — Encounter: Payer: Self-pay | Admitting: *Deleted

## 2012-05-20 ENCOUNTER — Other Ambulatory Visit: Payer: Self-pay | Admitting: *Deleted

## 2012-05-20 MED ORDER — LISINOPRIL 20 MG PO TABS: 20.0000 mg | ORAL_TABLET | Freq: Every day | ORAL | Status: DC

## 2012-05-20 NOTE — Telephone Encounter (Signed)
Refilled Lisinopril sent to [facility].

## 2012-06-25 ENCOUNTER — Other Ambulatory Visit: Payer: Self-pay

## 2012-06-25 MED ORDER — SIMVASTATIN 40 MG PO TABS: 40.0000 mg | ORAL_TABLET | Freq: Every day | ORAL | Status: DC

## 2012-06-25 NOTE — Telephone Encounter (Signed)
Refill sent for simvastatin.  

## 2012-08-19 ENCOUNTER — Ambulatory Visit: Payer: Medicare Other | Admitting: Cardiovascular Disease

## 2012-08-20 ENCOUNTER — Other Ambulatory Visit: Payer: Self-pay | Admitting: *Deleted

## 2012-08-20 MED ORDER — NIACIN ER (ANTIHYPERLIPIDEMIC) 1000 MG PO TBCR: 1000.0000 mg | EXTENDED_RELEASE_TABLET | Freq: Every day | ORAL | Status: DC

## 2012-08-20 NOTE — Telephone Encounter (Signed)
Refilled Niacin sent to [facility].

## 2012-08-26 ENCOUNTER — Ambulatory Visit: Payer: Medicare Other | Admitting: Cardiovascular Disease

## 2012-09-06 ENCOUNTER — Encounter: Payer: Self-pay | Admitting: Cardiovascular Disease

## 2012-09-06 ENCOUNTER — Ambulatory Visit (INDEPENDENT_AMBULATORY_CARE_PROVIDER_SITE_OTHER): Payer: Medicare Other | Admitting: Cardiovascular Disease

## 2012-09-06 VITALS — BP 130/80 | HR 60 | Ht 72.0 in | Wt 222.5 lb

## 2012-09-06 DIAGNOSIS — I25810 Atherosclerosis of coronary artery bypass graft(s) without angina pectoris: Secondary | ICD-10-CM

## 2012-09-06 DIAGNOSIS — I10 Essential (primary) hypertension: Secondary | ICD-10-CM

## 2012-09-06 DIAGNOSIS — E785 Hyperlipidemia, unspecified: Secondary | ICD-10-CM

## 2012-09-06 NOTE — Assessment & Plan Note (Signed)
Cholesterol is at goal on the current lipid regimen. No changes to the medications were made.  

## 2012-09-06 NOTE — Assessment & Plan Note (Signed)
Blood pressure is well controlled on today's visit. No changes made to the medications. 

## 2012-09-06 NOTE — Progress Notes (Signed)
Patient ID: [name], male    DOB: [birth year], 72 y.o.   MRN: [id number]  HPI Comments: 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  He presents for routine follow up.   He has been very active, continues working with heavy machinery. He is working on his vegetable garden this summer. Staying very active. He feels good, no chest pain, no shortness of breath, no other symptoms. He does not smoke though he does have second-hand exposure as his wife continues to smoke.  Previous lab work showed Total cholesterol 138, HDL 49   EKG: NSR with rate of 60 bpm, no significant ST or T wave changes    Outpatient Encounter Prescriptions as of 09/06/2012  Medication Sig Dispense Refill  . amLODipine (NORVASC) 10 MG tablet Take 0.5 tablets (5 mg total) by mouth daily.  30 tablet  6  . aspirin 325 MG buffered tablet Take 325 mg by mouth 2 (two) times daily.      Marland Kitchen lisinopril (PRINIVIL,ZESTRIL) 20 MG tablet Take 1 tablet (20 mg total) by mouth daily.  90 tablet  6  . metoprolol tartrate (LOPRESSOR) 25 MG tablet Take 1 tablet (25 mg total) by mouth 2 (two) times daily.  60 tablet  6  . Multiple Vitamins-Minerals (ONE-A-DAY EXTRAS ANTIOXIDANT) CAPS Take by mouth daily.        . niacin (NIASPAN) 1000 MG CR tablet Take 1 tablet (1,000 mg total) by mouth at bedtime.  30 tablet  3  . simvastatin (ZOCOR) 40 MG tablet Take 1 tablet (40 mg total) by mouth at bedtime.  90 tablet  3   No facility-administered encounter medications on file as of 09/06/2012.     Review of Systems  Constitutional: Negative.   HENT: Negative.   Eyes: Negative.   Respiratory: Negative.   Cardiovascular: Negative.   Gastrointestinal: Negative.   Musculoskeletal: Negative.   Skin: Negative.   Neurological: Negative.    Psychiatric/Behavioral: Negative.   All other systems reviewed and are negative.    BP 130/80  Pulse 60  Ht 6' (1.829 m)  Wt 222 lb 8 oz (100.925 kg)  BMI 30.17 kg/m2  Physical Exam  Nursing note and vitals reviewed. Constitutional: He is oriented to person, place, and time. He appears well-developed and well-nourished.  HENT:  Head: Normocephalic.  Nose: Nose normal.  Mouth/Throat: Oropharynx is clear and moist.  Eyes: Conjunctivae are normal. Pupils are equal, round, and reactive to light.  Neck: Normal range of motion. Neck supple. No JVD present.  Cardiovascular: Normal rate, regular rhythm, S1 normal, S2 normal, normal heart sounds and intact distal pulses.  Exam reveals no gallop and no friction rub.   No murmur heard. Pulmonary/Chest: Effort normal and breath sounds normal. No respiratory distress. He has no wheezes. He has no rales. He exhibits no tenderness.  Abdominal: Soft. Bowel sounds are normal. He exhibits no distension. There is no tenderness.  Musculoskeletal: Normal range of motion. He exhibits no edema and no tenderness.  Lymphadenopathy:    He has no cervical adenopathy.  Neurological: He is alert and oriented to person, place, and time. Coordination normal.  Skin: Skin is warm and dry. No rash noted. No erythema.  Psychiatric: He has a normal  mood and affect. His behavior is normal. Judgment and thought content normal.      Assessment and Plan

## 2012-09-06 NOTE — Patient Instructions (Addendum)
You are doing well. No medication changes were made.  Please call us if you have new issues that need to be addressed before your next appt.  Your physician wants you to follow-up in: 6 months.  You will receive a reminder letter in the mail two months in advance. If you don't receive a letter, please call our office to schedule the follow-up appointment.   

## 2012-09-06 NOTE — Assessment & Plan Note (Signed)
Currently with no symptoms of angina. No further workup at this time. Continue current medication regimen. 

## 2012-09-18 ENCOUNTER — Other Ambulatory Visit: Payer: Self-pay

## 2012-09-18 MED ORDER — METOPROLOL TARTRATE 25 MG PO TABS: 25.0000 mg | ORAL_TABLET | Freq: Two times a day (BID) | ORAL | Status: DC

## 2012-09-18 NOTE — Telephone Encounter (Signed)
Refill sent for metoprolol tar 25 mg

## 2012-10-07 ENCOUNTER — Other Ambulatory Visit: Payer: Self-pay

## 2012-10-07 MED ORDER — AMLODIPINE BESYLATE 10 MG PO TABS: 5.0000 mg | ORAL_TABLET | Freq: Every day | ORAL | Status: DC

## 2012-10-07 MED ORDER — LISINOPRIL 20 MG PO TABS: 20.0000 mg | ORAL_TABLET | Freq: Every day | ORAL | Status: DC

## 2012-10-07 NOTE — Telephone Encounter (Signed)
Refill sent for lisinopril 20 mg  

## 2012-10-07 NOTE — Telephone Encounter (Signed)
Refill sent for amlodipine.  

## 2012-12-11 ENCOUNTER — Emergency Department: Payer: Self-pay | Admitting: Emergency Medicine

## 2012-12-31 ENCOUNTER — Other Ambulatory Visit: Payer: Self-pay

## 2012-12-31 MED ORDER — METOPROLOL TARTRATE 25 MG PO TABS: 25.0000 mg | ORAL_TABLET | Freq: Two times a day (BID) | ORAL | Status: DC

## 2012-12-31 NOTE — Telephone Encounter (Signed)
Refill sent for metoprolol tart 25 mg take one tablet twice a day.  

## 2013-03-04 ENCOUNTER — Other Ambulatory Visit: Payer: Self-pay

## 2013-03-04 ENCOUNTER — Other Ambulatory Visit: Payer: Self-pay | Admitting: *Deleted

## 2013-03-04 MED ORDER — METOPROLOL TARTRATE 25 MG PO TABS: 25.0000 mg | ORAL_TABLET | Freq: Two times a day (BID) | ORAL | Status: DC

## 2013-03-04 NOTE — Telephone Encounter (Signed)
Requested Prescriptions   Signed Prescriptions Disp Refills   metoprolol tartrate (LOPRESSOR) 25 MG tablet 180 tablet 3    Sig: Take 1 tablet (25 mg total) by mouth 2 (two) times daily.    Authorizing Provider: GOLLAN, TIMOTHY J    Ordering User: LOPEZ, MARINA C    

## 2013-03-04 NOTE — Telephone Encounter (Signed)
Refill sent for metoprolol tart 25 mg  

## 2013-03-10 ENCOUNTER — Ambulatory Visit: Payer: Medicare Other | Admitting: Cardiovascular Disease

## 2013-03-12 ENCOUNTER — Encounter: Payer: Self-pay | Admitting: Cardiovascular Disease

## 2013-03-12 ENCOUNTER — Ambulatory Visit (INDEPENDENT_AMBULATORY_CARE_PROVIDER_SITE_OTHER): Payer: Medicare Other | Admitting: Cardiovascular Disease

## 2013-03-12 VITALS — BP 122/72 | HR 59 | Ht 74.0 in | Wt 221.5 lb

## 2013-03-12 DIAGNOSIS — I10 Essential (primary) hypertension: Secondary | ICD-10-CM

## 2013-03-12 DIAGNOSIS — E785 Hyperlipidemia, unspecified: Secondary | ICD-10-CM

## 2013-03-12 DIAGNOSIS — I25810 Atherosclerosis of coronary artery bypass graft(s) without angina pectoris: Secondary | ICD-10-CM

## 2013-03-12 NOTE — Assessment & Plan Note (Signed)
Currently with no symptoms of angina. No further workup at this time. Continue current medication regimen. 

## 2013-03-12 NOTE — Assessment & Plan Note (Signed)
Blood pressure is well controlled on today's visit. No changes made to the medications. 

## 2013-03-12 NOTE — Assessment & Plan Note (Signed)
Cholesterol is at goal on the current lipid regimen. No changes to the medications were made.  

## 2013-03-12 NOTE — Progress Notes (Signed)
   Patient ID: [name], male    DOB: [birth year], 73 y.o.   MRN: [id number]  HPI Comments: Mr. 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  He presents for routine follow up.   He has been very active, continues working with heavy machinery.  Staying very active. He feels good, no chest pain, no shortness of breath, no other symptoms. He does not smoke He stopped his Niaspan on her last trip to the office. Weight is down 4 pounds  Previous lab work showed Total cholesterol 138, HDL 49   EKG: NSR with rate of 59 bpm, with T wave abnormality in leads 3, aVF    Outpatient Encounter Prescriptions as of 03/12/2013  Medication Sig  . amLODipine (NORVASC) 10 MG tablet Take 5 mg by mouth daily.  Marland Kitchen. aspirin 325 MG buffered tablet Take 325 mg by mouth 2 (two) times daily.  Marland Kitchen. lisinopril (PRINIVIL,ZESTRIL) 20 MG tablet Take 1 tablet (20 mg total) by mouth daily.  . metoprolol tartrate (LOPRESSOR) 25 MG tablet Take 1 tablet (25 mg total) by mouth 2 (two) times daily.  . Multiple Vitamins-Minerals (ONE-A-DAY EXTRAS ANTIOXIDANT) CAPS Take by mouth daily.    . niacin (NIASPAN) 1000 MG CR tablet Take 1 tablet (1,000 mg total) by mouth at bedtime.  . simvastatin (ZOCOR) 40 MG tablet Take 1 tablet (40 mg total) by mouth at bedtime.     Review of Systems  Constitutional: Negative.   HENT: Negative.   Eyes: Negative.   Respiratory: Negative.   Cardiovascular: Negative.   Gastrointestinal: Negative.   Endocrine: Negative.   Musculoskeletal: Negative.   Skin: Negative.   Allergic/Immunologic: Negative.   Neurological: Negative.   Hematological: Negative.   Psychiatric/Behavioral: Negative.   All other systems reviewed and are negative.   BP 122/72  Pulse 59  Ht 6\' 2"  (1.88 m)  Wt 221 lb 8  oz (100.472 kg)  BMI 28.43 kg/m2  Physical Exam  Nursing note and vitals reviewed. Constitutional: He is oriented to person, place, and time. He appears well-developed and well-nourished.  HENT:  Head: Normocephalic.  Nose: Nose normal.  Mouth/Throat: Oropharynx is clear and moist.  Eyes: Conjunctivae are normal. Pupils are equal, round, and reactive to light.  Neck: Normal range of motion. Neck supple. No JVD present.  Cardiovascular: Normal rate, regular rhythm, S1 normal, S2 normal, normal heart sounds and intact distal pulses.  Exam reveals no gallop and no friction rub.   No murmur heard. Pulmonary/Chest: Effort normal and breath sounds normal. No respiratory distress. He has no wheezes. He has no rales. He exhibits no tenderness.  Abdominal: Soft. Bowel sounds are normal. He exhibits no distension. There is no tenderness.  Musculoskeletal: Normal range of motion. He exhibits no edema and no tenderness.  Lymphadenopathy:    He has no cervical adenopathy.  Neurological: He is alert and oriented to person, place, and time. Coordination normal.  Skin: Skin is warm and dry. No rash noted. No erythema.  Psychiatric: He has a normal mood and affect. His behavior is normal. Judgment and thought content normal.      Assessment and Plan

## 2013-03-12 NOTE — Patient Instructions (Signed)
Your physician recommends that you schedule a follow-up appointment in: 6 months.  Continue with your current medications.

## 2013-06-27 ENCOUNTER — Other Ambulatory Visit: Payer: Self-pay | Admitting: *Deleted

## 2013-06-27 MED ORDER — SIMVASTATIN 40 MG PO TABS: 40.0000 mg | ORAL_TABLET | Freq: Every day | ORAL | Status: DC

## 2013-06-27 NOTE — Telephone Encounter (Signed)
Requested Prescriptions   Signed Prescriptions Disp Refills  . simvastatin (ZOCOR) 40 MG tablet 90 tablet 3    Sig: Take 1 tablet (40 mg total) by mouth at bedtime.    Authorizing Provider: GOLLAN, TIMOTHY J    Ordering User: LOPEZ, MARINA C    

## 2013-07-08 ENCOUNTER — Other Ambulatory Visit: Payer: Self-pay

## 2013-07-08 MED ORDER — LISINOPRIL 20 MG PO TABS: 20.0000 mg | ORAL_TABLET | Freq: Every day | ORAL | Status: DC

## 2013-07-08 MED ORDER — AMLODIPINE BESYLATE 10 MG PO TABS: 5.0000 mg | ORAL_TABLET | Freq: Every day | ORAL | Status: DC

## 2013-07-08 NOTE — Telephone Encounter (Signed)
Refill sent for amlodipine.  

## 2013-07-08 NOTE — Telephone Encounter (Signed)
Refill sent for Lisinopril.

## 2013-09-08 ENCOUNTER — Ambulatory Visit: Payer: No Typology Code available for payment source | Admitting: Cardiovascular Disease

## 2013-09-16 ENCOUNTER — Encounter: Payer: Self-pay | Admitting: Cardiovascular Disease

## 2013-09-16 ENCOUNTER — Ambulatory Visit (INDEPENDENT_AMBULATORY_CARE_PROVIDER_SITE_OTHER): Payer: Medicare Other | Admitting: Cardiovascular Disease

## 2013-09-16 VITALS — BP 140/80 | HR 62 | Ht 74.0 in | Wt 205.8 lb

## 2013-09-16 DIAGNOSIS — I10 Essential (primary) hypertension: Secondary | ICD-10-CM

## 2013-09-16 DIAGNOSIS — I159 Secondary hypertension, unspecified: Secondary | ICD-10-CM

## 2013-09-16 DIAGNOSIS — H8110 Benign paroxysmal vertigo, unspecified ear: Secondary | ICD-10-CM

## 2013-09-16 DIAGNOSIS — I25810 Atherosclerosis of coronary artery bypass graft(s) without angina pectoris: Secondary | ICD-10-CM

## 2013-09-16 DIAGNOSIS — E785 Hyperlipidemia, unspecified: Secondary | ICD-10-CM

## 2013-09-16 DIAGNOSIS — I158 Other secondary hypertension: Secondary | ICD-10-CM

## 2013-09-16 DIAGNOSIS — R42 Dizziness and giddiness: Secondary | ICD-10-CM

## 2013-09-16 NOTE — Assessment & Plan Note (Signed)
Blood pressure is well controlled on today's visit. No changes made to the medications. 

## 2013-09-16 NOTE — Patient Instructions (Addendum)
You are doing well. No medication changes were made.  Come in this week for blood work Ok to take meclizine (other the counter) for vertigo  Please call us if you have new issues that need to be addressed before your next appt.  Your physician wants you to follow-up in: 6 months.  You will receive a reminder letter in the mail two months in advance. If you don't receive a letter, please call our office to schedule the follow-up appointment.

## 2013-09-16 NOTE — Assessment & Plan Note (Signed)
Encouraged him to continue on Dramamine/meclizine for symptoms as needed

## 2013-09-16 NOTE — Assessment & Plan Note (Signed)
We'll check his cholesterol this week. Typically runs cholesterol 130

## 2013-09-16 NOTE — Progress Notes (Signed)
   Patient ID: [name], male    DOB: 1940-09-01, 73 y.o.   MRN: [id number]  HPI Comments: Mr. 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  He presents for routine follow up.   Recent symptoms of vertigo, improved with Dramamine today He has been very active, continues working with heavy machinery.  Staying very active. Recently was on vacation, got a speeding ticket He feels good, no chest pain, no shortness of breath, no other symptoms. He does not smoke He stopped his Niaspan on a prior clinic visit.   Previous lab work showed Total cholesterol 138, HDL 49 Will draw blood work tomorrow as EKG today   EKG: NSR with rate of 67 bpm, with T wave abnormality in leads 3, aVF   Outpatient Encounter Prescriptions as of 09/16/2013  Medication Sig  . amLODipine (NORVASC) 10 MG tablet Take 0.5 tablets (5 mg total) by mouth daily.  Marland Kitchen. aspirin 325 MG buffered tablet Take 325 mg by mouth 2 (two) times daily.  Marland Kitchen. lisinopril (PRINIVIL,ZESTRIL) 20 MG tablet Take 1 tablet (20 mg total) by mouth daily.  . metoprolol tartrate (LOPRESSOR) 25 MG tablet Take 1 tablet (25 mg total) by mouth 2 (two) times daily.  . Multiple Vitamins-Minerals (ONE-A-DAY EXTRAS ANTIOXIDANT) CAPS Take by mouth daily.    . simvastatin (ZOCOR) 40 MG tablet Take 1 tablet (40 mg total) by mouth at bedtime.     Review of Systems  Constitutional: Negative.   HENT: Negative.   Eyes: Negative.   Respiratory: Negative.   Cardiovascular: Negative.   Gastrointestinal: Negative.   Endocrine: Negative.   Musculoskeletal: Negative.   Skin: Negative.   Allergic/Immunologic: Negative.   Neurological: Negative.   Hematological: Negative.   Psychiatric/Behavioral: Negative.   All other systems reviewed and are negative.  BP  140/80  Pulse 62  Ht 6\' 2"  (1.88 m)  Wt 205 lb 12 oz (93.328 kg)  BMI 26.41 kg/m2  Physical Exam  Nursing note and vitals reviewed. Constitutional: He is oriented to person, place, and time. He appears well-developed and well-nourished.  HENT:  Head: Normocephalic.  Nose: Nose normal.  Mouth/Throat: Oropharynx is clear and moist.  Eyes: Conjunctivae are normal. Pupils are equal, round, and reactive to light.  Neck: Normal range of motion. Neck supple. No JVD present.  Cardiovascular: Normal rate, regular rhythm, S1 normal, S2 normal, normal heart sounds and intact distal pulses.  Exam reveals no gallop and no friction rub.   No murmur heard. Pulmonary/Chest: Effort normal and breath sounds normal. No respiratory distress. He has no wheezes. He has no rales. He exhibits no tenderness.  Abdominal: Soft. Bowel sounds are normal. He exhibits no distension. There is no tenderness.  Musculoskeletal: Normal range of motion. He exhibits no edema and no tenderness.  Lymphadenopathy:    He has no cervical adenopathy.  Neurological: He is alert and oriented to person, place, and time. Coordination normal.  Skin: Skin is warm and dry. No rash noted. No erythema.  Psychiatric: He has a normal mood and affect. His behavior is normal. Judgment and thought content normal.      Assessment and Plan

## 2013-09-16 NOTE — Assessment & Plan Note (Signed)
Currently with no symptoms of angina. No further workup at this time. Continue current medication regimen. 

## 2013-09-17 ENCOUNTER — Ambulatory Visit (INDEPENDENT_AMBULATORY_CARE_PROVIDER_SITE_OTHER): Payer: Medicare Other | Admitting: *Deleted

## 2013-09-17 DIAGNOSIS — R42 Dizziness and giddiness: Secondary | ICD-10-CM

## 2013-09-17 DIAGNOSIS — I159 Secondary hypertension, unspecified: Secondary | ICD-10-CM

## 2013-09-17 DIAGNOSIS — I25810 Atherosclerosis of coronary artery bypass graft(s) without angina pectoris: Secondary | ICD-10-CM

## 2013-09-18 LAB — HEPATIC FUNCTION PANEL
ALBUMIN: 4.1 g/dL (ref 3.5–4.8)
ALT: 17 IU/L (ref 0–44)
AST: 18 IU/L (ref 0–40)
Alkaline Phosphatase: 70 IU/L (ref 39–117)
Bilirubin, Direct: 0.15 mg/dL (ref 0.00–0.40)
TOTAL PROTEIN: 6.3 g/dL (ref 6.0–8.5)
Total Bilirubin: 0.5 mg/dL (ref 0.0–1.2)

## 2013-09-18 LAB — LIPID PANEL
CHOL/HDL RATIO: 3.5 ratio (ref 0.0–5.0)
Cholesterol, Total: 137 mg/dL (ref 100–199)
HDL: 39 mg/dL — ABNORMAL LOW (ref >39)
LDL CALC: 73 mg/dL (ref 0–99)
Triglycerides: 125 mg/dL (ref 0–149)
VLDL Cholesterol Cal: 25 mg/dL (ref 5–40)

## 2013-12-30 ENCOUNTER — Other Ambulatory Visit: Payer: Self-pay

## 2013-12-30 MED ORDER — AMLODIPINE BESYLATE 10 MG PO TABS: 5.0000 mg | ORAL_TABLET | Freq: Every day | ORAL | Status: DC

## 2013-12-30 NOTE — Telephone Encounter (Signed)
Refill sent for amlodipine.  

## 2014-02-13 ENCOUNTER — Other Ambulatory Visit: Payer: Self-pay

## 2014-02-13 MED ORDER — LISINOPRIL 20 MG PO TABS: 20.0000 mg | ORAL_TABLET | Freq: Every day | ORAL | Status: DC

## 2014-02-13 NOTE — Telephone Encounter (Signed)
Refill sent for lisinopril  

## 2014-02-16 ENCOUNTER — Other Ambulatory Visit: Payer: Self-pay | Admitting: *Deleted

## 2014-02-16 ENCOUNTER — Telehealth: Payer: Self-pay | Admitting: Cardiovascular Disease

## 2014-02-16 MED ORDER — LISINOPRIL 20 MG PO TABS: 20.0000 mg | ORAL_TABLET | Freq: Every day | ORAL | Status: DC

## 2014-02-16 NOTE — Telephone Encounter (Signed)
°  1. Which medications need to be refilled? Lisinopril  2. Which pharmacy is medication to be sent to? [facility] [address]   3. Do they need a 30 day or 90 day supply? 90 day   4. Would they like a call back once the medication has been sent to the pharmacy? Yes    Pt has been trying since Tuesday of last week but pharmacy has no record of patient trying to get this done. Pt only has two pills left, please call in a 90 day so they won't run out as fast again.

## 2014-03-11 ENCOUNTER — Other Ambulatory Visit: Payer: Self-pay

## 2014-03-11 MED ORDER — METOPROLOL TARTRATE 25 MG PO TABS: 25.0000 mg | ORAL_TABLET | Freq: Two times a day (BID) | ORAL | Status: DC

## 2014-03-11 MED ORDER — METOPROLOL TARTRATE 25 MG PO TABS
25.0000 mg | ORAL_TABLET | Freq: Two times a day (BID) | ORAL | Status: DC
Start: 2014-03-11 — End: 2014-03-18

## 2014-03-11 NOTE — Telephone Encounter (Signed)
Refills sent for metoprolol to [facility].

## 2014-03-11 NOTE — Telephone Encounter (Signed)
Refill sent for metoprolol 25 mg take one tablet twice a day.

## 2014-03-18 ENCOUNTER — Encounter: Payer: Self-pay | Admitting: Cardiovascular Disease

## 2014-03-18 ENCOUNTER — Ambulatory Visit (INDEPENDENT_AMBULATORY_CARE_PROVIDER_SITE_OTHER): Payer: Medicare Other | Admitting: Cardiovascular Disease

## 2014-03-18 VITALS — BP 118/80 | HR 58 | Resp 16 | Ht 74.0 in | Wt 215.5 lb

## 2014-03-18 DIAGNOSIS — I25810 Atherosclerosis of coronary artery bypass graft(s) without angina pectoris: Secondary | ICD-10-CM

## 2014-03-18 DIAGNOSIS — E785 Hyperlipidemia, unspecified: Secondary | ICD-10-CM

## 2014-03-18 DIAGNOSIS — I10 Essential (primary) hypertension: Secondary | ICD-10-CM

## 2014-03-18 MED ORDER — METOPROLOL TARTRATE 25 MG PO TABS: 25.0000 mg | ORAL_TABLET | Freq: Two times a day (BID) | ORAL | Status: DC

## 2014-03-18 MED ORDER — AMLODIPINE BESYLATE 10 MG PO TABS: 5.0000 mg | ORAL_TABLET | Freq: Every day | ORAL | Status: DC

## 2014-03-18 MED ORDER — SIMVASTATIN 40 MG PO TABS: 40.0000 mg | ORAL_TABLET | Freq: Every day | ORAL | Status: DC

## 2014-03-18 MED ORDER — LISINOPRIL 20 MG PO TABS: 20.0000 mg | ORAL_TABLET | Freq: Every day | ORAL | Status: DC

## 2014-03-18 NOTE — Patient Instructions (Signed)
You are doing well. No medication changes were made.  Please call us if you have new issues that need to be addressed before your next appt.  Your physician wants you to follow-up in: 6 months.  You will receive a reminder letter in the mail two months in advance. If you don't receive a letter, please call our office to schedule the follow-up appointment.   

## 2014-03-18 NOTE — Assessment & Plan Note (Signed)
Blood pressure is well controlled on today's visit. No changes made to the medications. 

## 2014-03-18 NOTE — Assessment & Plan Note (Signed)
Cholesterol is at goal on the current lipid regimen. No changes to the medications were made.  

## 2014-03-18 NOTE — Progress Notes (Signed)
Patient ID: [name], male    DOB: [birth year], 74 y.o.   MRN: [id number]  HPI Comments: 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  He presents for routine follow up of his coronary artery disease.   In follow-up today, he denies any significant chest pain. 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 Her the patient, he does not sit still. Denies having any significant chest pain or shortness of breath with exertion.  EKG shows normal sinus rhythm with rate 58 bpm, no significant ST or T-wave changes  Other past medical history Previously had vertigo, improved with meclizine  Previous lab work showed Total cholesterol 138, HDL 49       Allergies  Allergen Reactions  . Codeine Nausea And Vomiting    Outpatient Encounter Prescriptions as of 03/18/2014  Medication Sig  . aspirin EC 81 MG tablet Take 81 mg by mouth 2 (two) times daily.  Marland Kitchen amLODipine (NORVASC) 10 MG tablet Take 0.5 tablets (5 mg total) by mouth daily.  Marland Kitchen lisinopril (PRINIVIL,ZESTRIL) 20 MG tablet Take 1 tablet (20 mg total) by mouth daily.  . metoprolol tartrate (LOPRESSOR) 25 MG tablet Take 1 tablet (25 mg total) by mouth 2 (two) times daily.  . Multiple Vitamins-Minerals (ONE-A-DAY EXTRAS ANTIOXIDANT) CAPS Take by mouth daily.    . simvastatin (ZOCOR) 40 MG tablet Take 1 tablet (40 mg total) by mouth at bedtime.  . [DISCONTINUED] amLODipine (NORVASC) 10 MG tablet Take 0.5 tablets (5 mg total) by mouth daily.  . [DISCONTINUED] aspirin 325 MG buffered tablet Take 325 mg by mouth 2 (two) times daily.  . [DISCONTINUED] lisinopril (PRINIVIL,ZESTRIL) 20 MG tablet Take 1 tablet (20 mg total) by mouth daily.  . [DISCONTINUED] metoprolol tartrate (LOPRESSOR) 25 MG tablet Take 1 tablet (25 mg  total) by mouth 2 (two) times daily.  . [DISCONTINUED] simvastatin (ZOCOR) 40 MG tablet Take 1 tablet (40 mg total) by mouth at bedtime.    Past Medical History  Diagnosis Date  . Arthritis   . CAD (coronary artery disease)     CABG 2007 with LIMA-LAD, SVG-D, sequential SVG-OM and CFX PDA. Myoview 12/09 was probably a negative study with attenuation artifact  . HLD (hyperlipidemia)   . HTN (hypertension)   . Positional vertigo   . History of kidney stones late 43's early 21's  . Myocardial infarction 2008    light    Past Surgical History  Procedure Laterality Date  . Coronary artery bypass graft    . Bypass surgery (other)  August 2007  . Shoulder surgery    . Tonsillectomy    . Colonoscopy      Social History  reports that he has quit smoking. He has never used smokeless tobacco. He reports that he does not drink alcohol or use illicit drugs.  Family History family history includes Alzheimer's disease in his father; Colon cancer in his father; Diabetes in his sister; Prostate cancer in his brother.   Review of Systems  Constitutional: Negative.   Respiratory: Negative.   Cardiovascular: Negative.   Gastrointestinal: Negative.   Musculoskeletal: Negative.   Skin: Negative.   Neurological: Negative.   Hematological: Negative.   Psychiatric/Behavioral: Negative.   All other systems reviewed  and are negative.  BP 118/80 mmHg  Pulse 58  Resp 16  Ht 6\' 2"  (1.88 m)  Wt 215 lb 8 oz (97.75 kg)  BMI 27.66 kg/m2  Physical Exam  Constitutional: He is oriented to person, place, and time. He appears well-developed and well-nourished.  HENT:  Head: Normocephalic.  Nose: Nose normal.  Mouth/Throat: Oropharynx is clear and moist.  Eyes: Conjunctivae are normal. Pupils are equal, round, and reactive to light.  Neck: Normal range of motion. Neck supple. No JVD present.  Cardiovascular: Normal rate, regular rhythm, S1 normal, S2 normal, normal heart sounds and intact distal  pulses.  Exam reveals no gallop and no friction rub.   No murmur heard. Pulmonary/Chest: Effort normal and breath sounds normal. No respiratory distress. He has no wheezes. He has no rales. He exhibits no tenderness.  Abdominal: Soft. Bowel sounds are normal. He exhibits no distension. There is no tenderness.  Musculoskeletal: Normal range of motion. He exhibits no edema or tenderness.  Lymphadenopathy:    He has no cervical adenopathy.  Neurological: He is alert and oriented to person, place, and time. Coordination normal.  Skin: Skin is warm and dry. No rash noted. No erythema.  Psychiatric: He has a normal mood and affect. His behavior is normal. Judgment and thought content normal.      Assessment and Plan   Nursing note and vitals reviewed.

## 2014-03-18 NOTE — Assessment & Plan Note (Signed)
Currently with no symptoms of angina. No further workup at this time. Continue current medication regimen. 

## 2014-03-24 ENCOUNTER — Encounter: Payer: Self-pay | Admitting: Cardiovascular Disease

## 2014-09-29 ENCOUNTER — Ambulatory Visit (INDEPENDENT_AMBULATORY_CARE_PROVIDER_SITE_OTHER): Payer: Medicare Other | Admitting: Cardiovascular Disease

## 2014-09-29 ENCOUNTER — Encounter: Payer: Self-pay | Admitting: Cardiovascular Disease

## 2014-09-29 VITALS — BP 124/78 | HR 55 | Ht 74.0 in | Wt 218.5 lb

## 2014-09-29 DIAGNOSIS — I25810 Atherosclerosis of coronary artery bypass graft(s) without angina pectoris: Secondary | ICD-10-CM | POA: Diagnosis not present

## 2014-09-29 DIAGNOSIS — E785 Hyperlipidemia, unspecified: Secondary | ICD-10-CM | POA: Diagnosis not present

## 2014-09-29 DIAGNOSIS — I10 Essential (primary) hypertension: Secondary | ICD-10-CM

## 2014-09-29 NOTE — Assessment & Plan Note (Signed)
We will draw lab work today. Previously at goal Weight up several pounds

## 2014-09-29 NOTE — Progress Notes (Signed)
Patient ID: [name], male    DOB: 1940-10-04, 74 y.o.   MRN: [id number]  HPI Comments: [name] is a 74 yo since with a h/o  coronary artery disease, HTN, CABG in August of 2007 with a LIMA  to the LAD, vein graft to the diagonal, and graft to the OM and PDA of the circumflex, as well as history of hypertension and hyperlipidemia.  [name] presents for routine follow up of his coronary artery disease.  In follow-up today, [name] reports that [name] is doing well. Very active, currently working on his tractor motor. Denies any chest pain concerning for angina. No significant shortness of breath with exertion Heart rate is low but [name] does not have any symptoms No problems with his medications  EKG on today's visit shows no sinus rhythm with rate 55 bpm, no significant ST or T-wave changes  Other past medical history Previously had vertigo, improved with meclizine  Previous lab work showed Total cholesterol 138, HDL 49       Allergies  Allergen Reactions  . Codeine Nausea And Vomiting    Outpatient Encounter Prescriptions as of 09/29/2014  Medication Sig  . amLODipine (NORVASC) 10 MG tablet Take 0.5 tablets (5 mg total) by mouth daily.  Marland Kitchen aspirin EC 81 MG tablet Take 81 mg by mouth 2 (two) times daily.  Marland Kitchen lisinopril (PRINIVIL,ZESTRIL) 20 MG tablet Take 1 tablet (20 mg total) by mouth daily.  . metoprolol tartrate (LOPRESSOR) 25 MG tablet Take 1 tablet (25 mg total) by mouth 2 (two) times daily.  . Multiple Vitamins-Minerals (ONE-A-DAY EXTRAS ANTIOXIDANT) CAPS Take by mouth daily.    . simvastatin (ZOCOR) 40 MG tablet Take 1 tablet (40 mg total) by mouth at bedtime.   No facility-administered encounter medications on file as of 09/29/2014.    Past Medical History  Diagnosis Date  . Arthritis   . CAD (coronary artery disease)     CABG 2007 with LIMA-LAD, SVG-D, sequential SVG-OM and CFX PDA. Myoview 12/09 was probably a negative study with attenuation artifact  . HLD  (hyperlipidemia)   . HTN (hypertension)   . Positional vertigo   . History of kidney stones late 50's early 38's  . Myocardial infarction 2008    light    Past Surgical History  Procedure Laterality Date  . Coronary artery bypass graft    . Bypass surgery (other)  August 2007  . Shoulder surgery    . Tonsillectomy    . Colonoscopy      Social History  reports that [name] has quit smoking. [name] has never used smokeless tobacco. [name] reports that [name] does not drink alcohol or use illicit drugs.  Family History family history includes Alzheimer's disease in his father; Colon cancer in his father; Diabetes in his sister; Prostate cancer in his brother.   Review of Systems  Constitutional: Negative.   Respiratory: Negative.   Cardiovascular: Negative.   Gastrointestinal: Negative.   Musculoskeletal: Negative.   Skin: Negative.   Neurological: Negative.   Hematological: Negative.   Psychiatric/Behavioral: Negative.   All other systems reviewed and are negative.  BP 124/78 mmHg  Pulse 55  Ht  (1.88 m)  Wt 218 lb 8 oz (99.111 kg)  BMI 28.04 kg/m2  Physical Exam  Constitutional: [name] is oriented to person, place, and time. [name] appears well-developed and well-nourished.  HENT:  Head: Normocephalic.  Nose: Nose normal.  Mouth/Throat: Oropharynx is clear and moist.  Eyes: Conjunctivae are normal. Pupils  are equal, round, and reactive to light.  Neck: Normal range of motion. Neck supple. No JVD present.  Cardiovascular: Normal rate, regular rhythm, S1 normal, S2 normal, normal heart sounds and intact distal pulses.  Exam reveals no gallop and no friction rub.   No murmur heard. Pulmonary/Chest: Effort normal and breath sounds normal. No respiratory distress. [name] has no wheezes. [name] has no rales. [name] exhibits no tenderness.  Abdominal: Soft. Bowel sounds are normal. [name] exhibits no distension. There is no tenderness.  Musculoskeletal: Normal range of motion. [name] exhibits no edema or  tenderness.  Lymphadenopathy:    [name] has no cervical adenopathy.  Neurological: [name] is alert and oriented to person, place, and time. Coordination normal.  Skin: Skin is warm and dry. No rash noted. No erythema.  Psychiatric: [name] has a normal mood and affect. His behavior is normal. Judgment and thought content normal.      Assessment and Plan   Nursing note and vitals reviewed.

## 2014-09-29 NOTE — Patient Instructions (Signed)
You are doing well. No medication changes were made.  We will check lipids and lfts today, fasting  Please call us if you have new issues that need to be addressed before your next appt.  Your physician wants you to follow-up in: 6 months.  You will receive a reminder letter in the mail two months in advance. If you don't receive a letter, please call our office to schedule the follow-up appointment.

## 2014-09-29 NOTE — Assessment & Plan Note (Signed)
Currently with no symptoms of angina. No further workup at this time. Continue current medication regimen. 

## 2014-09-29 NOTE — Assessment & Plan Note (Signed)
Blood pressure is well controlled on today's visit. No changes made to the medications. 

## 2014-09-30 LAB — LIPID PANEL
CHOL/HDL RATIO: 3.7 ratio (ref 0.0–5.0)
Cholesterol, Total: 131 mg/dL (ref 100–199)
HDL: 35 mg/dL — ABNORMAL LOW (ref >39)
LDL CALC: 64 mg/dL (ref 0–99)
TRIGLYCERIDES: 159 mg/dL — AB (ref 0–149)
VLDL Cholesterol Cal: 32 mg/dL (ref 5–40)

## 2014-09-30 LAB — HEPATIC FUNCTION PANEL
ALT: 25 [IU]/L (ref 0–44)
AST: 21 [IU]/L (ref 0–40)
Albumin: 4.3 g/dL (ref 3.5–4.8)
Alkaline Phosphatase: 70 [IU]/L (ref 39–117)
Bilirubin Total: 0.8 mg/dL (ref 0.0–1.2)
Bilirubin, Direct: 0.2 mg/dL (ref 0.00–0.40)
Total Protein: 6.9 g/dL (ref 6.0–8.5)

## 2015-03-12 ENCOUNTER — Other Ambulatory Visit: Payer: Self-pay | Admitting: Cardiovascular Disease

## 2015-04-02 ENCOUNTER — Ambulatory Visit (INDEPENDENT_AMBULATORY_CARE_PROVIDER_SITE_OTHER): Payer: Medicare Other | Admitting: Cardiovascular Disease

## 2015-04-02 ENCOUNTER — Encounter: Payer: Self-pay | Admitting: Cardiovascular Disease

## 2015-04-02 VITALS — BP 124/80 | HR 61 | Ht 74.0 in | Wt 201.5 lb

## 2015-04-02 DIAGNOSIS — E785 Hyperlipidemia, unspecified: Secondary | ICD-10-CM

## 2015-04-02 DIAGNOSIS — I25810 Atherosclerosis of coronary artery bypass graft(s) without angina pectoris: Secondary | ICD-10-CM | POA: Diagnosis not present

## 2015-04-02 DIAGNOSIS — I10 Essential (primary) hypertension: Secondary | ICD-10-CM

## 2015-04-02 NOTE — Assessment & Plan Note (Signed)
Blood pressure is well controlled on today's visit. No changes made to the medications. 

## 2015-04-02 NOTE — Progress Notes (Signed)
Patient ID: [name], male    DOB: 26-Aug-1940, 75 y.o.   MRN: [id number]  HPI Comments: [name] is a 75 yo gentleman with a h/o  coronary artery disease, HTN, CABG in August of 2007 with a LIMA  to the LAD, vein graft to the diagonal, and graft to the OM and PDA of the circumflex, as well as history of hypertension and hyperlipidemia.  He presents for routine follow up of his coronary artery disease.  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 Codeine Nausea And Vomiting    Outpatient Encounter Prescriptions as of 04/02/2015  Medication Sig  . amLODipine (NORVASC) 10 MG tablet Take 0.5 tablets (5 mg total) by mouth daily.  Marland Kitchen aspirin EC 81 MG tablet Take 81 mg by mouth 2 (two) times daily.  Marland Kitchen lisinopril (PRINIVIL,ZESTRIL) 20 MG tablet Take 1 tablet (20 mg total) by mouth daily.  . metoprolol tartrate (LOPRESSOR) 25 MG tablet Take 1 tablet (25 mg total) by mouth 2 (two) times daily.  . Multiple Vitamins-Minerals (ONE-A-DAY EXTRAS ANTIOXIDANT) CAPS Take by mouth daily.    . niacin 500 MG tablet Take 500 mg by mouth at bedtime.  . simvastatin (ZOCOR) 40 MG tablet TAKE 1 TABLET (40 MG TOTAL) BY MOUTH AT BEDTIME.   No facility-administered encounter medications on file as of 04/02/2015.    Past Medical History  Diagnosis Date  . Arthritis   . CAD (coronary artery disease)     CABG 2007 with  LIMA-LAD, SVG-D, sequential SVG-OM and CFX PDA. Myoview 12/09 was probably a negative study with attenuation artifact  . HLD (hyperlipidemia)   . HTN (hypertension)   . Positional vertigo   . History of kidney stones late 83's early 46's  . Myocardial infarction [facility]) 2008    light    Past Surgical History  Procedure Laterality Date  . Coronary artery bypass graft    . Bypass surgery (other)  August 2007  . Shoulder surgery    . Tonsillectomy    . Colonoscopy      Social History  reports that he has quit smoking. He has never used smokeless tobacco. He reports that he does not drink alcohol or use illicit drugs.  Family History family history includes Alzheimer's disease in his father; Colon cancer in his father; Diabetes in his sister; Prostate cancer in his brother.   Review of Systems  Constitutional: Negative.   Respiratory: Negative.   Cardiovascular: Negative.   Gastrointestinal: Negative.   Musculoskeletal: Negative.   Skin: Negative.   Neurological: Negative.   Hematological: Negative.   Psychiatric/Behavioral: Negative.   All other systems reviewed and are negative.  BP 124/80 mmHg  Pulse 61  Ht  (1.88 m)  Wt 201 lb 8 oz (91.4 kg)  BMI 25.86 kg/m2  Physical Exam  Constitutional: He is oriented to person, place,  and time. He appears well-developed and well-nourished.  HENT:  Head: Normocephalic.  Nose: Nose normal.  Mouth/Throat: Oropharynx is clear and moist.  Eyes: Conjunctivae are normal. Pupils are equal, round, and reactive to light.  Neck: Normal range of motion. Neck supple. No JVD present.  Cardiovascular: Normal rate, regular rhythm, S1 normal, S2 normal, normal heart sounds and intact distal pulses.  Exam reveals no gallop and no friction rub.   No murmur heard. Pulmonary/Chest: Effort normal and breath sounds normal. No respiratory distress. He has no wheezes. He has no rales. He exhibits no tenderness.  Abdominal: Soft. Bowel sounds are  normal. He exhibits no distension. There is no tenderness.  Musculoskeletal: Normal range of motion. He exhibits no edema or tenderness.  Lymphadenopathy:    He has no cervical adenopathy.  Neurological: He is alert and oriented to person, place, and time. Coordination normal.  Skin: Skin is warm and dry. No rash noted. No erythema.  Psychiatric: He has a normal mood and affect. His behavior is normal. Judgment and thought content normal.      Assessment and Plan   Nursing note and vitals reviewed.

## 2015-04-02 NOTE — Assessment & Plan Note (Signed)
Cholesterol is at goal on the current lipid regimen. No changes to the medications were made.  

## 2015-04-02 NOTE — Patient Instructions (Signed)
You are doing well. No medication changes were made.  Please call us if you have new issues that need to be addressed before your next appt.  Your physician wants you to follow-up in: 6 months.  You will receive a reminder letter in the mail two months in advance. If you don't receive a letter, please call our office to schedule the follow-up appointment.   

## 2015-04-02 NOTE — Assessment & Plan Note (Signed)
Currently with no symptoms of angina. No further workup at this time. Continue current medication regimen. 

## 2015-06-07 ENCOUNTER — Other Ambulatory Visit: Payer: Self-pay | Admitting: Cardiovascular Disease

## 2015-06-10 ENCOUNTER — Other Ambulatory Visit: Payer: Self-pay | Admitting: Cardiovascular Disease

## 2015-06-10 ENCOUNTER — Encounter: Payer: Self-pay | Admitting: Gastroenterology

## 2015-06-10 MED ORDER — LISINOPRIL 20 MG PO TABS: ORAL_TABLET | ORAL | Status: DC

## 2015-06-10 NOTE — Telephone Encounter (Signed)
Pt has 1 left 

## 2015-06-15 ENCOUNTER — Other Ambulatory Visit: Payer: Self-pay | Admitting: Cardiovascular Disease

## 2015-08-16 ENCOUNTER — Encounter: Payer: Self-pay | Admitting: Gastroenterology

## 2015-08-16 ENCOUNTER — Ambulatory Visit (INDEPENDENT_AMBULATORY_CARE_PROVIDER_SITE_OTHER): Payer: Medicare Other | Admitting: Gastroenterology

## 2015-08-16 VITALS — BP 100/70 | HR 68 | Ht 74.0 in | Wt 204.0 lb

## 2015-08-16 DIAGNOSIS — I25810 Atherosclerosis of coronary artery bypass graft(s) without angina pectoris: Secondary | ICD-10-CM

## 2015-08-16 DIAGNOSIS — R1310 Dysphagia, unspecified: Secondary | ICD-10-CM | POA: Diagnosis not present

## 2015-08-16 NOTE — Progress Notes (Signed)
HPI :  75 y/o male here for dysphagia evaluation. He was last seen in our office in 2013. He has a history of CABG and doing well in this regard to cardiopulmonary status.    He reports he has been having dysphagia with swallows. Symptoms ongoing for 30 years or so. He reports dysphagia worse to liquids, sometimes solids. He reports sometimes he has to vomit up the contents when it causes pain. He reports it occurs sporadically, can often be precipitated if he rushes his eating. He endorses the sensation is in his lower sternum. He has rare reflux sympytoms, not common, only rare pyrosis. He has never had a prior endoscopy or evaluation for these symptoms. He thinks stable symptoms over 30 years. No unexpected weight loss. He has a prior tobacco use, quit age 8040s.   Father had colon cancer, he thinks diagnosed close to age 77.  Colonoscopy 12/22/2011 - diverticulosis, no polyps  Past Medical History  Diagnosis Date  . Arthritis   . CAD (coronary artery disease)     CABG 2007 with LIMA-LAD, SVG-D, sequential SVG-OM and CFX PDA. Myoview 12/09 was probably a negative study with attenuation artifact  . HLD (hyperlipidemia)   . HTN (hypertension)   . Positional vertigo   . History of kidney stones late 5970's early 1680's  . Myocardial infarction [facility] 2008    light     Past Surgical History  Procedure Laterality Date  . Coronary artery bypass graft    . Bypass surgery (other)  August 2007  . Shoulder surgery    . Tonsillectomy    . Colonoscopy     Family History  Problem Relation Age of Onset  . Alzheimer's disease Father   . Colon cancer Father   . Diabetes Sister   . Prostate cancer Brother    Social History  Substance Use Topics  . Smoking status: Former Games developermoker  . Smokeless tobacco: Never Used  . Alcohol Use: No   Current Outpatient Prescriptions  Medication Sig Dispense Refill  . amLODipine (NORVASC) 10 MG tablet TAKE 1/2 TABLETS (5 MG TOTAL) BY MOUTH DAILY. 45 tablet 6    . aspirin EC 81 MG tablet Take 81 mg by mouth 2 (two) times daily.    Marland Kitchen. lisinopril (PRINIVIL,ZESTRIL) 20 MG tablet TAKE 1 TABLET (20 MG TOTAL) BY MOUTH DAILY. 90 tablet 3  . metoprolol tartrate (LOPRESSOR) 25 MG tablet TAKE 1 TABLET (25 MG TOTAL) BY MOUTH 2 (TWO) TIMES DAILY. 180 tablet 3  . Multiple Vitamins-Minerals (ONE-A-DAY EXTRAS ANTIOXIDANT) CAPS Take by mouth daily.      . niacin 500 MG tablet Take 500 mg by mouth at bedtime.    . simvastatin (ZOCOR) 40 MG tablet TAKE 1 TABLET (40 MG TOTAL) BY MOUTH AT BEDTIME. 90 tablet 3   No current facility-administered medications for this visit.   Allergies  Allergen Reactions  . Codeine Nausea And Vomiting     Review of Systems: All systems reviewed and negative except where noted in HPI.    Physical Exam: BP 100/70 mmHg  Pulse 68  Ht 6\' 2"  (1.88 m)  Wt 204 lb (92.534 kg)  BMI 26.18 kg/m2 Constitutional: Pleasant,well-developed, male in no acute distress. HEENT: Normocephalic and atraumatic. Conjunctivae are normal. No scleral icterus. Neck supple.  Cardiovascular: Normal rate, regular rhythm. Midline chest scar Pulmonary/chest: Effort normal and breath sounds normal. No wheezing, rales or rhonchi. Abdominal: Soft, nondistended, nontender. Bowel sounds active throughout. There are no masses palpable. No hepatomegaly.  Extremities: no edema Lymphadenopathy: No cervical adenopathy noted. Neurological: Alert and oriented to person place and time. Skin: Skin is warm and dry. No rashes noted. Psychiatric: Normal mood and affect. Behavior is normal.   ASSESSMENT AND PLAN: 75 y/o male with PMH as outlined above presenting with longstanding (>30  Years) symptoms of dysphagia, interestingly worse with liquids compared to solids. He has not had any prior evaluation for this symptom despite the longstanding nature of it. I discussed ddx for his symptoms. Recommend an upper endoscopy to initially evaluate this, and can dilate as needed if  stricture present. If the EGD is negative may consider manometry / barium study to assess esophageal functioning. Following discussed of risks / benefits of EGD he wished to proceed. Stable from cardiac standpoint. Further recommendations pending this result.   Ileene Patrick, MD [facility] Pager [phone]  CC: Kaleen Mask, *

## 2015-08-16 NOTE — Patient Instructions (Signed)
If you are age 77 or older, your body mass index should be between 23-30. Your Body mass index is 26.18 kg/(m^2). If this is out of the aforementioned range listed, please consider follow up with your Primary Care Provider.  If you are age 664 or younger, your body mass index should be between 19-25. Your Body mass index is 26.18 kg/(m^2). If this is out of the aformentioned range listed, please consider follow up with your Primary Care Provider.   You have been scheduled for an endoscopy. Please follow written instructions given to you at your visit today. If you use inhalers (even only as needed), please bring them with you on the day of your procedure. Your physician has requested that you go to www.startemmi.com and enter the access code given to you at your visit today. This web site gives a general overview about your procedure. However, you should still follow specific instructions given to you by our office regarding your preparation for the procedure.  Thank you for choosing [facility]  Dr Adela LankArmbruster

## 2015-09-30 ENCOUNTER — Encounter: Payer: Self-pay | Admitting: Cardiovascular Disease

## 2015-09-30 ENCOUNTER — Ambulatory Visit (INDEPENDENT_AMBULATORY_CARE_PROVIDER_SITE_OTHER): Payer: Medicare Other | Admitting: Cardiovascular Disease

## 2015-09-30 VITALS — BP 122/64 | HR 57 | Ht 73.0 in | Wt 204.5 lb

## 2015-09-30 DIAGNOSIS — I10 Essential (primary) hypertension: Secondary | ICD-10-CM

## 2015-09-30 DIAGNOSIS — I25810 Atherosclerosis of coronary artery bypass graft(s) without angina pectoris: Secondary | ICD-10-CM | POA: Diagnosis not present

## 2015-09-30 DIAGNOSIS — E785 Hyperlipidemia, unspecified: Secondary | ICD-10-CM

## 2015-09-30 NOTE — Patient Instructions (Signed)

## 2015-09-30 NOTE — Progress Notes (Signed)
Cardiology Office Note  Date:  09/30/2015   ID:  [name], [name] 06/01/1940, MRN [id number]  PCP:  Kaleen Mask, MD   Chief Complaint  Patient presents with  . Other    6 month f/u no complaints. Meds reviewed verbally with pt.    HPI:  [name] is a 75 yo gentleman with a h/o  coronary artery disease, HTN, CABG in August of 2007 with a LIMA  to the LAD, vein graft to the diagonal, and graft to the OM and PDA of the circumflex, as well as history of hypertension and hyperlipidemia.  He presents for routine follow up of his coronary artery disease.  In follow-up today, he reports that he Has no new symptoms Continues to work on heavy machinery, tractors Working on a new engine Denies any anginal symptoms  Exercises daily No problems with his medications He has modified his diet for weight loss  Previous total cholesterol was all was 130  EKG on today's visit shows no sinus rhythm with rate 57 bpm, no significant ST or T-wave changes  Other past medical history Previously had vertigo, improved with meclizine   PMH:   has a past medical history of Arthritis; CAD (coronary artery disease); History of kidney stones (late 64's early 88's); HLD (hyperlipidemia); HTN (hypertension); Myocardial infarction [facility]) (2008); and Positional vertigo.  PSH:    Past Surgical History:  Procedure Laterality Date  . bypass surgery ([facility]R)  August 2007  . COLONOSCOPY    . CORONARY ARTERY BYPASS GRAFT    . SHOULDER SURGERY    . TONSILLECTOMY      Current Outpatient Prescriptions  Medication Sig Dispense Refill  . amLODipine (NORVASC) 10 MG tablet TAKE 1/2 TABLETS (5 MG TOTAL) BY MOUTH DAILY. 45 tablet 6  . aspirin EC 81 MG tablet Take 81 mg by mouth 2 (two) times daily.    . Bilberry 1000 MG CAPS Take 1 capsule by mouth daily.    Marland Kitchen lisinopril (PRINIVIL,ZESTRIL) 20 MG tablet TAKE 1 TABLET (20 MG TOTAL) BY MOUTH DAILY. 90 tablet 3  . metoprolol tartrate (LOPRESSOR) 25  MG tablet TAKE 1 TABLET (25 MG TOTAL) BY MOUTH 2 (TWO) TIMES DAILY. 180 tablet 3  . Multiple Vitamins-Minerals (ONE-A-DAY EXTRAS ANTIOXIDANT) CAPS Take by mouth daily.      . niacin 500 MG tablet Take 500 mg by mouth at bedtime.    . simvastatin (ZOCOR) 40 MG tablet TAKE 1 TABLET (40 MG TOTAL) BY MOUTH AT BEDTIME. 90 tablet 3   No current facility-administered medications for this visit.      Allergies:   Codeine   Social History:  The patient  reports that he has quit smoking. He has never used smokeless tobacco. He reports that he does not drink alcohol or use drugs.   Family History:   family history includes Alzheimer's disease in his father; Colon cancer in his father; Diabetes in his mother and sister; Prostate cancer in his brother and father.    Review of Systems: Review of Systems  Constitutional: Negative.   Respiratory: Negative.   Cardiovascular: Negative.   Gastrointestinal: Negative.   Musculoskeletal: Negative.   Neurological: Negative.   Psychiatric/Behavioral: Negative.   All other systems reviewed and are negative.    PHYSICAL EXAM: VS:  BP 122/64 (BP Location: Left Arm, Patient Position: Sitting, Cuff Size: Normal)   Pulse (!) 57   Ht 6\' 1"  (1.854 m)   Wt 204 lb 8 oz (92.8 kg)  BMI 26.98 kg/m  , BMI Body mass index is 26.98 kg/m. GEN: Well nourished, well developed, in no acute distress  HEENT: normal  Neck: no JVD, carotid bruits, or masses Cardiac: RRR; no murmurs, rubs, or gallops,no edema  Respiratory:  clear to auscultation bilaterally, normal work of breathing GI: soft, nontender, nondistended, + BS MS: no deformity or atrophy  Skin: warm and dry, no rash Neuro:  Strength and sensation are intact Psych: euthymic mood, full affect    Recent Labs: No results found for requested labs within last 8760 hours.    Lipid Panel Lab Results  Component Value Date   CHOL 131 09/29/2014   HDL 35 (L) 09/29/2014   LDLCALC 64 09/29/2014   TRIG 159  (H) 09/29/2014      Wt Readings from Last 3 Encounters:  09/30/15 204 lb 8 oz (92.8 kg)  08/16/15 204 lb (92.5 kg)  04/02/15 201 lb 8 oz (91.4 kg)       ASSESSMENT AND PLAN:  Atherosclerosis of coronary artery bypass graft of native heart without angina pectoris - Plan: EKG 12-Lead Currently with no symptoms of angina. No further workup at this time. Continue current medication regimen.  HYPERTENSION, BENIGN - Plan: EKG 12-Lead Blood pressure is well controlled on today's visit. No changes made to the medications.  Hyperlipidemia Cholesterol is at goal on the current lipid regimen. No changes to the medications were made.  order placed to have blood drawn tomorrow    Total encounter time more than 15 minutes  Greater than 50% was spent in counseling and coordination of care with the patient   Disposition:   F/U  6 months   Orders Placed This Encounter  Procedures  . EKG 12-Lead     Signed, Dossie Arbourim Ramzey Petrovic, M.D., Ph.D. 09/30/2015  [facility], [place] [phone]

## 2015-10-01 ENCOUNTER — Other Ambulatory Visit (INDEPENDENT_AMBULATORY_CARE_PROVIDER_SITE_OTHER): Payer: Medicare Other

## 2015-10-01 DIAGNOSIS — I25810 Atherosclerosis of coronary artery bypass graft(s) without angina pectoris: Secondary | ICD-10-CM | POA: Diagnosis not present

## 2015-10-01 DIAGNOSIS — I10 Essential (primary) hypertension: Secondary | ICD-10-CM

## 2015-10-01 DIAGNOSIS — E785 Hyperlipidemia, unspecified: Secondary | ICD-10-CM

## 2015-10-02 LAB — LIPID PANEL
CHOL/HDL RATIO: 3.3 ratio (ref 0.0–5.0)
Cholesterol, Total: 127 mg/dL (ref 100–199)
HDL: 38 mg/dL — AB (ref >39)
LDL CALC: 73 mg/dL (ref 0–99)
TRIGLYCERIDES: 82 mg/dL (ref 0–149)
VLDL Cholesterol Cal: 16 mg/dL (ref 5–40)

## 2015-10-02 LAB — HEPATIC FUNCTION PANEL
ALBUMIN: 4.2 g/dL (ref 3.5–4.8)
ALT: 19 IU/L (ref 0–44)
AST: 20 IU/L (ref 0–40)
Alkaline Phosphatase: 63 IU/L (ref 39–117)
BILIRUBIN TOTAL: 0.7 mg/dL (ref 0.0–1.2)
Bilirubin, Direct: 0.16 mg/dL (ref 0.00–0.40)
TOTAL PROTEIN: 6.2 g/dL (ref 6.0–8.5)

## 2015-10-05 ENCOUNTER — Encounter: Payer: Medicare Other | Admitting: Gastroenterology

## 2016-02-20 ENCOUNTER — Other Ambulatory Visit: Payer: Self-pay | Admitting: Cardiovascular Disease

## 2016-02-21 NOTE — Telephone Encounter (Signed)
Lmov for patient to call back and schedule appointment for refills. °

## 2016-02-21 NOTE — Telephone Encounter (Signed)
Pt needs f/u appt with Mariah MillingGollan for Feb. Thanks!

## 2016-02-24 NOTE — Telephone Encounter (Signed)
Lmov for patient to call back and schedule appointment for refills. °

## 2016-02-25 NOTE — Telephone Encounter (Signed)
Lmov for patient to call back and schedule appointment for refills  Third Time calling.  No answer.

## 2016-03-17 ENCOUNTER — Telehealth: Payer: Self-pay | Admitting: Cardiovascular Disease

## 2016-03-17 NOTE — Telephone Encounter (Signed)
Pt c/o BP issue: STAT if pt c/o blurred vision, one-sided weakness or slurred speech  1. What are your last 5 BP readings?  03/16/16: 96/57 HR 79 Waited a little bit 96/65 HR 79 He only had yesterday's BP machine  2. Are you having any other symptoms (ex. Dizziness, headache, blurred vision, passed out)? When he stands up he gets a bit dizzy 3. What is your BP issue?  States his BP is Low again

## 2016-03-17 NOTE — Telephone Encounter (Signed)
Spoke with patient in detail about his blood pressure readings and he denies any shortness of breath or chest pain. He states that sometimes he does have dizziness but that it goes away. Let him know that transitioning from sitting, standing, and laying can sometimes cause changes in blood pressure which could cause dizziness and that he should use caution and transition slowly with different positions. He went to [facility] to have blood pressures checked there and they were within normal range. Instructed him to continue monitoring his blood pressure and keep a log of them. Let him know to call back if his symptoms worsen.

## 2016-03-17 NOTE — Telephone Encounter (Signed)
Pt calling back to let us know his new BP readings for today He went to [facility] and got a reading of 117/73 HR 78 Then waited about 5 minutes  126/80 HR 79

## 2016-03-19 NOTE — Telephone Encounter (Signed)
If BP runs low, he could cut lisinopril in 1/2

## 2016-04-19 ENCOUNTER — Other Ambulatory Visit: Payer: Self-pay

## 2016-04-19 MED ORDER — AMLODIPINE BESYLATE 10 MG PO TABS: ORAL_TABLET | ORAL | 6 refills | Status: DC

## 2016-05-18 ENCOUNTER — Other Ambulatory Visit: Payer: Self-pay | Admitting: Dermatology

## 2016-05-25 ENCOUNTER — Other Ambulatory Visit: Payer: Self-pay | Admitting: Cardiovascular Disease

## 2016-05-25 NOTE — Telephone Encounter (Signed)
Pt needs appt with Gollan. Thanks!

## 2016-05-31 NOTE — Telephone Encounter (Signed)
Lmov for patient to call back  He needs appointment with Dr Mariah Milling for Refills

## 2016-06-06 NOTE — Telephone Encounter (Signed)
Lmov for patient to call back  He needs appointment with Dr Mariah MillingGollan for Refills

## 2016-06-07 ENCOUNTER — Other Ambulatory Visit: Payer: Self-pay | Admitting: Family Medicine

## 2016-06-07 ENCOUNTER — Ambulatory Visit
Admission: RE | Admit: 2016-06-07 | Discharge: 2016-06-07 | Disposition: A | Discharge status: Home / Self Care | Payer: Medicare Other | Source: Ambulatory Visit | Attending: Family Medicine | Admitting: Family Medicine

## 2016-06-07 DIAGNOSIS — M25552 Pain in left hip: Secondary | ICD-10-CM

## 2016-06-08 NOTE — Telephone Encounter (Signed)
Pt is coming 06/15/16 to see Dr Mariah MillingGollan

## 2016-06-14 NOTE — Progress Notes (Signed)
Cardiology Office Note  Date:  06/15/2016   ID:  [name], [name] [birth year], MRN [id number]  PCP:  Kaleen Mask, MD   Chief Complaint  Patient presents with  . other    12 month follow up. Meds reviewed by the pt. verbally. "doing well."     HPI:   [name] is a 76 yo gentleman with a h/o   coronary artery disease, HTN,  CABG in August of 2007 with a LIMA  to the LAD,  vein graft to the diagonal, and graft to the OM and PDA of the circumflex,  hypertension  hyperlipidemia.   Who presents for routine follow up of his coronary artery disease.  In follow-up today he is doing well Denies any chest pain concerning for angina Continues to work on heavy machinery, tractors Recently slipped 3 weeks ago , hurt his left hip, having pain when he stands for long periods of time  No problems with his medications Weight has been trending upwards, eating biscuits  Previous total cholesterol was all was 130 He is fasting today and requesting lab work  EKG  personally reviewed by myself on today's visit shows no sinus rhythm with rate 58 bpm, no significant ST or T-wave changes  Other past medical history Previously had vertigo, improved with meclizine   PMH:   has a past medical history of Arthritis; CAD (coronary artery disease); History of kidney stones (late 42's early 43's); HLD (hyperlipidemia); HTN (hypertension); Myocardial infarction [facility]) (2008); and Positional vertigo.  PSH:    Past Surgical History:  Procedure Laterality Date  . bypass surgery (otheR)  August 2007  . COLONOSCOPY    . CORONARY ARTERY BYPASS GRAFT    . SHOULDER SURGERY    . TONSILLECTOMY      Current Outpatient Prescriptions  Medication Sig Dispense Refill  . amLODipine (NORVASC) 10 MG tablet TAKE 1/2 TABLETS (5 MG TOTAL) BY MOUTH DAILY. 45 tablet 6  . amoxicillin (AMOXIL) 875 MG tablet Take 875 mg by mouth 2 (two) times daily.     Marland Kitchen aspirin EC 81 MG tablet Take 81 mg by mouth  2 (two) times daily.    . Bilberry 1000 MG CAPS Take 1 capsule by mouth daily.    Marland Kitchen lisinopril (PRINIVIL,ZESTRIL) 20 MG tablet TAKE 1 TABLET (20 MG TOTAL) BY MOUTH DAILY. 90 tablet 3  . metoprolol tartrate (LOPRESSOR) 25 MG tablet TAKE 1 TABLET (25 MG TOTAL) BY MOUTH 2 (TWO) TIMES DAILY. 60 tablet 0  . Multiple Vitamins-Minerals (ONE-A-DAY EXTRAS ANTIOXIDANT) CAPS Take by mouth daily.      . niacin 500 MG tablet Take 500 mg by mouth at bedtime.    . simvastatin (ZOCOR) 40 MG tablet TAKE 1 TABLET (40 MG TOTAL) BY MOUTH AT BEDTIME. 90 tablet 1   No current facility-administered medications for this visit.      Allergies:   Codeine   Social History:  The patient  reports that he has quit smoking. He has never used smokeless tobacco. He reports that he does not drink alcohol or use drugs.   Family History:   family history includes Alzheimer's disease in his father; Colon cancer in his father; Diabetes in his mother and sister; Prostate cancer in his brother and father.    Review of Systems: Review of Systems  Constitutional: Negative.   Respiratory: Negative.   Cardiovascular: Negative.   Gastrointestinal: Negative.   Musculoskeletal: Positive for joint pain.  Neurological: Negative.   Psychiatric/Behavioral: Negative.  All other systems reviewed and are negative.    PHYSICAL EXAM: VS:  BP 138/82 (BP Location: Left Arm, Patient Position: Sitting, Cuff Size: Normal)   Pulse (!) 58   Ht 6\' 1"  (1.854 m)   Wt 208 lb (94.3 kg)   BMI 27.44 kg/m  , BMI Body mass index is 27.44 kg/m. GEN: Well nourished, well developed, in no acute distress  HEENT: normal  Neck: no JVD, carotid bruits, or masses Cardiac: RRR; no murmurs, rubs, or gallops,no edema  Respiratory:  clear to auscultation bilaterally, normal work of breathing GI: soft, nontender, nondistended, + BS MS: no deformity or atrophy  Skin: warm and dry, no rash Neuro:  Strength and sensation are intact Psych: euthymic mood,  full affectO    Recent Labs: 10/01/2015: ALT 19    Lipid Panel Lab Results  Component Value Date   CHOL 127 10/01/2015   HDL 38 (L) 10/01/2015   LDLCALC 73 10/01/2015   TRIG 82 10/01/2015      Wt Readings from Last 3 Encounters:  06/15/16 208 lb (94.3 kg)  09/30/15 204 lb 8 oz (92.8 kg)  08/16/15 204 lb (92.5 kg)       ASSESSMENT AND PLAN:  Atherosclerosis of coronary artery bypass graft of native heart without angina pectoris - Plan: EKG 12-Lead Currently with no symptoms of angina. No further workup at this time. Continue current medication regimen.  HYPERTENSION, BENIGN - Plan: EKG 12-Lead Blood pressure is well controlled on today's visit. No changes made to the medications.  Hyperlipidemia Cholesterol is at goal on the current lipid regimen. No changes to the medications were made. Order placed we will draw labs today  Hip pain Traumatic injury 3 weeks ago, likely musculoskeletal X-ray reviewed with him showing degenerative change without acute abnormality, no fracture Recommended he avoid heavy lifting or heavy work until symptoms improved   Total encounter time more than 25 minutes  Greater than 50% was spent in counseling and coordination of care with the patient   Disposition:   F/U  12 months   Orders Placed This Encounter  Procedures  . Lipid panel  . Hepatic function panel  . EKG 12-Lead     Signed, Dossie Arbourim Gollan, M.D., Ph.D. 06/15/2016  [facility], [place] [phone]

## 2016-06-15 ENCOUNTER — Encounter: Payer: Self-pay | Admitting: Cardiovascular Disease

## 2016-06-15 ENCOUNTER — Ambulatory Visit (INDEPENDENT_AMBULATORY_CARE_PROVIDER_SITE_OTHER): Payer: Medicare Other | Admitting: Cardiovascular Disease

## 2016-06-15 VITALS — BP 138/82 | HR 58 | Ht 73.0 in | Wt 208.0 lb

## 2016-06-15 DIAGNOSIS — I25708 Atherosclerosis of coronary artery bypass graft(s), unspecified, with other forms of angina pectoris: Secondary | ICD-10-CM

## 2016-06-15 DIAGNOSIS — E782 Mixed hyperlipidemia: Secondary | ICD-10-CM

## 2016-06-15 DIAGNOSIS — M25552 Pain in left hip: Secondary | ICD-10-CM | POA: Diagnosis not present

## 2016-06-15 DIAGNOSIS — I10 Essential (primary) hypertension: Secondary | ICD-10-CM

## 2016-06-15 DIAGNOSIS — I209 Angina pectoris, unspecified: Secondary | ICD-10-CM | POA: Diagnosis not present

## 2016-06-15 MED ORDER — AMLODIPINE BESYLATE 10 MG PO TABS: ORAL_TABLET | ORAL | 6 refills | Status: DC

## 2016-06-15 NOTE — Patient Instructions (Addendum)
Medication Instructions:   No medication changes made  Labwork:  We will check liver and lipids today  Testing/Procedures:  No further testing at this time   I recommend watching educational videos on topics of interest to you at:       [email or web address]  Enter code: HEARTCARE    Follow-Up: It was a pleasure seeing you in the office today. Please call us if you have new issues that need to be addressed before your next appt.  [phone]  Your physician wants you to follow-up in: 12 months.  You will receive a reminder letter in the mail two months in advance. If you don't receive a letter, please call our office to schedule the follow-up appointment.  If you need a refill on your cardiac medications before your next appointment, please call your pharmacy.     

## 2016-06-16 LAB — HEPATIC FUNCTION PANEL
ALT: 17 IU/L (ref 0–44)
AST: 19 IU/L (ref 0–40)
Albumin: 4.2 g/dL (ref 3.5–4.8)
Alkaline Phosphatase: 64 IU/L (ref 39–117)
BILIRUBIN TOTAL: 0.6 mg/dL (ref 0.0–1.2)
Bilirubin, Direct: 0.16 mg/dL (ref 0.00–0.40)
Total Protein: 6.8 g/dL (ref 6.0–8.5)

## 2016-06-16 LAB — LIPID PANEL
CHOL/HDL RATIO: 3.7 ratio (ref 0.0–5.0)
Cholesterol, Total: 143 mg/dL (ref 100–199)
HDL: 39 mg/dL — ABNORMAL LOW (ref >39)
LDL CALC: 77 mg/dL (ref 0–99)
TRIGLYCERIDES: 137 mg/dL (ref 0–149)
VLDL CHOLESTEROL CAL: 27 mg/dL (ref 5–40)

## 2016-06-20 ENCOUNTER — Other Ambulatory Visit: Payer: Self-pay | Admitting: Cardiovascular Disease

## 2016-07-27 ENCOUNTER — Encounter (INDEPENDENT_AMBULATORY_CARE_PROVIDER_SITE_OTHER): Payer: Self-pay | Admitting: Orthopaedic Surgery

## 2016-07-27 ENCOUNTER — Ambulatory Visit (INDEPENDENT_AMBULATORY_CARE_PROVIDER_SITE_OTHER): Payer: Medicare Other | Admitting: Orthopaedic Surgery

## 2016-07-27 DIAGNOSIS — I25708 Atherosclerosis of coronary artery bypass graft(s), unspecified, with other forms of angina pectoris: Secondary | ICD-10-CM

## 2016-07-27 DIAGNOSIS — M25552 Pain in left hip: Secondary | ICD-10-CM

## 2016-07-27 NOTE — Progress Notes (Signed)
Office Visit Note   Patient: [name]           Date of Birth: May 15, 1940           MRN: [id number] Visit Date: 07/27/2016              Requested by: Kaleen Mask, MD [address] [place], [place] [address] PCP: Kaleen Mask, MD   Assessment & Plan: Visit Diagnoses:  1. Pain of left hip joint     Plan: Overall impression is left thigh contusion. Recommend continue symptomatic treatment. I don't think is coming from his hip or his knee. Recommend topical BenGay. Follow up with me as needed.  Follow-Up Instructions: Return if symptoms worsen or fail to improve.   Orders:  No orders of the defined types were placed in this encounter.  No orders of the defined types were placed in this encounter.     Procedures: No procedures performed   Clinical Data: No additional findings.   Subjective: Chief Complaint  Patient presents with  . Left Hip - Injury, Pain  . Left Leg - Injury, Pain    Patient is a 76 year old gentleman who is the brother [name] whose hip replacement for a femoral neck fracture. He comes in today with 7-8 week history of left thigh pain. He tripped over some bushes. He says he has pain that radiates from the thigh down to the knee. He denies any groin pain.  He has been taking ibuprofen occasionally.    Review of Systems  Constitutional: Negative.   All other systems reviewed and are negative.    Objective: Vital Signs: There were no vitals taken for this visit.  Physical Exam  Constitutional: He is oriented to person, place, and time. He appears well-developed and well-nourished.  HENT:  Head: Normocephalic and atraumatic.  Eyes: Pupils are equal, round, and reactive to light.  Neck: Neck supple.  Pulmonary/Chest: Effort normal.  Abdominal: Soft.  Musculoskeletal: Normal range of motion.  Neurological: He is alert and oriented to person, place, and time.  Skin: Skin is warm.  Psychiatric: He has a  normal mood and affect. His behavior is normal. Judgment and thought content normal.  Nursing note and vitals reviewed.   Ortho Exam Left hip and thigh exam shows painless range of motion of the hip. Negative Stinchfield. He mainly endorses pain in the distal thigh more the musculature. The knee exam is stable and benign. Specialty Comments:  No specialty comments available.  Imaging: No results found.   PMFS History: Patient Active Problem List   Diagnosis Date Noted  . Pain of left hip joint 06/15/2016  . POSITIONAL VERTIGO 05/14/2009  . Hyperlipidemia 03/03/2009  . HYPERTENSION, BENIGN 03/03/2009  . CORONARY ATHEROSCLEROSIS OF ARTERY BYPASS GRAFT 03/03/2009   Past Medical History:  Diagnosis Date  . Arthritis   . CAD (coronary artery disease)    CABG 2007 with LIMA-LAD, SVG-D, sequential SVG-OM and CFX PDA. Myoview 12/09 was probably a negative study with attenuation artifact  . History of kidney stones late 30's early 69's  . HLD (hyperlipidemia)   . HTN (hypertension)   . Myocardial infarction (HCC) 2008   light  . Positional vertigo     Family History  Problem Relation Age of Onset  . Alzheimer's disease Father   . Colon cancer Father   . Prostate cancer Father   . Diabetes Sister   . Prostate cancer Brother   . Diabetes Mother  Past Surgical History:  Procedure Laterality Date  . bypass surgery (otheR)  August 2007  . COLONOSCOPY    . CORONARY ARTERY BYPASS GRAFT    . SHOULDER SURGERY    . TONSILLECTOMY     Social History   Occupational History  . Retired    Social History Main Topics  . Smoking status: Former Games developermoker  . Smokeless tobacco: Never Used  . Alcohol use No  . Drug use: No  . Sexual activity: Not on file

## 2016-08-12 ENCOUNTER — Other Ambulatory Visit: Payer: Self-pay | Admitting: Cardiovascular Disease

## 2016-08-13 ENCOUNTER — Other Ambulatory Visit: Payer: Self-pay | Admitting: Cardiovascular Disease

## 2016-08-14 ENCOUNTER — Other Ambulatory Visit: Payer: Self-pay | Admitting: Cardiovascular Disease

## 2016-09-09 ENCOUNTER — Other Ambulatory Visit: Payer: Self-pay | Admitting: Cardiovascular Disease

## 2016-09-11 ENCOUNTER — Other Ambulatory Visit: Payer: Self-pay | Admitting: Cardiovascular Disease

## 2016-11-05 ENCOUNTER — Encounter (HOSPITAL_COMMUNITY): Payer: Self-pay | Admitting: Emergency Medicine

## 2016-11-05 ENCOUNTER — Emergency Department (HOSPITAL_COMMUNITY)
Admission: EM | Admit: 2016-11-05 | Discharge: 2016-11-06 | Disposition: A | Discharge status: Home / Self Care | Payer: Medicare Other | Attending: Emergency Medicine | Admitting: Emergency Medicine

## 2016-11-05 DIAGNOSIS — N4889 Other specified disorders of penis: Secondary | ICD-10-CM | POA: Diagnosis not present

## 2016-11-05 DIAGNOSIS — Z951 Presence of aortocoronary bypass graft: Secondary | ICD-10-CM | POA: Diagnosis not present

## 2016-11-05 DIAGNOSIS — Z87891 Personal history of nicotine dependence: Secondary | ICD-10-CM | POA: Diagnosis not present

## 2016-11-05 DIAGNOSIS — Z7982 Long term (current) use of aspirin: Secondary | ICD-10-CM | POA: Insufficient documentation

## 2016-11-05 DIAGNOSIS — I10 Essential (primary) hypertension: Secondary | ICD-10-CM | POA: Insufficient documentation

## 2016-11-05 DIAGNOSIS — I2510 Atherosclerotic heart disease of native coronary artery without angina pectoris: Secondary | ICD-10-CM | POA: Diagnosis not present

## 2016-11-05 DIAGNOSIS — R609 Edema, unspecified: Secondary | ICD-10-CM | POA: Diagnosis present

## 2016-11-05 DIAGNOSIS — N342 Other urethritis: Secondary | ICD-10-CM | POA: Diagnosis not present

## 2016-11-05 DIAGNOSIS — Z79899 Other long term (current) drug therapy: Secondary | ICD-10-CM | POA: Diagnosis not present

## 2016-11-05 LAB — CBC
HEMATOCRIT: 43 % (ref 39.0–52.0)
Hemoglobin: 14.3 g/dL (ref 13.0–17.0)
MCH: 31 pg (ref 26.0–34.0)
MCHC: 33.3 g/dL (ref 30.0–36.0)
MCV: 93.3 fL (ref 78.0–100.0)
PLATELETS: 188 10*3/uL (ref 150–400)
RBC: 4.61 MIL/uL (ref 4.22–5.81)
RDW: 13.3 % (ref 11.5–15.5)
WBC: 8.9 10*3/uL (ref 4.0–10.5)

## 2016-11-05 LAB — BASIC METABOLIC PANEL
Anion gap: 9 (ref 5–15)
BUN: 30 mg/dL — AB (ref 6–20)
CO2: 24 mmol/L (ref 22–32)
CREATININE: 1 mg/dL (ref 0.61–1.24)
Calcium: 8.6 mg/dL — ABNORMAL LOW (ref 8.9–10.3)
Chloride: 104 mmol/L (ref 101–111)
GFR calc Af Amer: >60 mL/min (ref >60)
GLUCOSE: 122 mg/dL — AB (ref 65–99)
POTASSIUM: 4.1 mmol/L (ref 3.5–5.1)
SODIUM: 137 mmol/L (ref 135–145)

## 2016-11-05 NOTE — ED Triage Notes (Signed)
Pt c/o white penile discharge and swelling x 2 days. Denies difficulty urinating/frequency. No pain.

## 2016-11-06 DIAGNOSIS — N4889 Other specified disorders of penis: Secondary | ICD-10-CM | POA: Diagnosis not present

## 2016-11-06 LAB — URINALYSIS, ROUTINE W REFLEX MICROSCOPIC
BACTERIA UA: NONE SEEN
Bilirubin Urine: NEGATIVE
Glucose, UA: NEGATIVE mg/dL
Hgb urine dipstick: NEGATIVE
Ketones, ur: NEGATIVE mg/dL
Nitrite: NEGATIVE
PH: 6 (ref 5.0–8.0)
Protein, ur: NEGATIVE mg/dL
SPECIFIC GRAVITY, URINE: 1.02 (ref 1.005–1.030)
SQUAMOUS EPITHELIAL / LPF: NONE SEEN

## 2016-11-06 LAB — GC/CHLAMYDIA PROBE AMP (~~LOC~~) NOT AT ARMC
Chlamydia: NEGATIVE
NEISSERIA GONORRHEA: POSITIVE — AB

## 2016-11-06 MED ORDER — LIDOCAINE HCL (PF) 1 % IJ SOLN
INTRAMUSCULAR | Status: AC
  Administered 2016-11-06: 2.1 mL
  Filled 2016-11-06: qty 5

## 2016-11-06 MED ORDER — CEFTRIAXONE SODIUM 1 G IJ SOLR
1.0000 g | Freq: Once | INTRAMUSCULAR | Status: AC
  Administered 2016-11-06: 1 g via INTRAMUSCULAR
  Filled 2016-11-06: qty 10

## 2016-11-06 MED ORDER — CEPHALEXIN 500 MG PO CAPS: 500.0000 mg | ORAL_CAPSULE | Freq: Four times a day (QID) | ORAL | 0 refills | Status: DC

## 2016-11-06 NOTE — ED Provider Notes (Signed)
[facility]-EMERGENCY DEPT Provider Note   CSN: [id number] Arrival date & time: 11/05/16  2041     History   Chief Complaint Chief Complaint  Patient presents with  . Edema    HPI Bradley GRANADE is a 76 y.o. male.  Patient is a 76 year old male with past mental history of coronary artery disease with CABG in 2007, hypertension, and renal calculi. He presents today for evaluation of swelling of his penis. He also reports some discharge from his urethra. This is been ongoing for the past several days. He denies any fevers or chills. He denies significant dysuria. He denies any abdominal pain. He is occasionally sexually active with his wife of many years. He denies any new sexual contacts.   The history is provided by the patient.    Past Medical History:  Diagnosis Date  . Arthritis   . CAD (coronary artery disease)    CABG 2007 with LIMA-LAD, SVG-D, sequential SVG-OM and CFX PDA. Myoview 12/09 was probably a negative study with attenuation artifact  . History of kidney stones late 35's early 52's  . HLD (hyperlipidemia)   . HTN (hypertension)   . Myocardial infarction (HCC) 2008   light  . Positional vertigo     Patient Active Problem List   Diagnosis Date Noted  . Pain of left hip joint 06/15/2016  . POSITIONAL VERTIGO 05/14/2009  . Hyperlipidemia 03/03/2009  . HYPERTENSION, BENIGN 03/03/2009  . CORONARY ATHEROSCLEROSIS OF ARTERY BYPASS GRAFT 03/03/2009    Past Surgical History:  Procedure Laterality Date  . bypass surgery (otheR)  August 2007  . COLONOSCOPY    . CORONARY ARTERY BYPASS GRAFT    . SHOULDER SURGERY    . TONSILLECTOMY         Home Medications    Prior to Admission medications   Medication Sig Start Date End Date Taking? Authorizing Provider  amLODipine (NORVASC) 10 MG tablet TAKE 1/2 TABLETS (5 MG TOTAL) BY MOUTH DAILY. 08/14/16   Antonieta Iba, MD  amoxicillin (AMOXIL) 875 MG tablet Take 875 mg by mouth 2 (two) times daily.  06/07/16    [provider]  aspirin EC 81 MG tablet Take 81 mg by mouth 2 (two) times daily.    [provider]  Bilberry 1000 MG CAPS Take 1 capsule by mouth daily.    [provider]  ibuprofen (ADVIL,MOTRIN) 200 MG tablet Take 200 mg by mouth every 6 (six) hours as needed.    [provider]  lisinopril (PRINIVIL,ZESTRIL) 20 MG tablet TAKE 1 TABLET (20 MG TOTAL) BY MOUTH DAILY. 08/14/16   Antonieta Iba, MD  metoprolol tartrate (LOPRESSOR) 25 MG tablet TAKE 1 TABLET (25 MG TOTAL) BY MOUTH 2 (TWO) TIMES DAILY. 09/11/16   Antonieta Iba, MD  Multiple Vitamins-Minerals (ONE-A-DAY EXTRAS ANTIOXIDANT) CAPS Take by mouth daily.      [provider]  niacin 500 MG tablet Take 500 mg by mouth at bedtime.    [provider]  simvastatin (ZOCOR) 40 MG tablet TAKE 1 TABLET BY MOUTH EVERYDAY AT BEDTIME 09/11/16   Antonieta Iba, MD    Family History Family History  Problem Relation Age of Onset  . Alzheimer's disease Father   . Colon cancer Father   . Prostate cancer Father   . Diabetes Sister   . Prostate cancer Brother   . Diabetes Mother     Social History Social History  Substance Use Topics  . Smoking status: Former Games developer  .  Smokeless tobacco: Never Used  . Alcohol use No     Allergies   Codeine   Review of Systems Review of Systems  All other systems reviewed and are negative.    Physical Exam Updated Vital Signs BP (!) 156/91 (BP Location: Left Arm)   Pulse 73   Temp 98.4 F (36.9 C) (Oral)   Resp 18   Ht  (1.854 m)   Wt 93.9 kg (207 lb)   SpO2 99%   BMI 27.31 kg/m   Physical Exam  Constitutional: He is oriented to person, place, and time. He appears well-developed and well-nourished. No distress.  HENT:  Head: Normocephalic and atraumatic.  Mouth/Throat: Oropharynx is clear and moist.  Neck: Normal range of motion. Neck supple.  Cardiovascular: Normal rate and regular rhythm.  Exam reveals no friction  rub.   No murmur heard. Pulmonary/Chest: Effort normal and breath sounds normal. No respiratory distress. He has no wheezes. He has no rales.  Abdominal: Soft. Bowel sounds are normal. He exhibits no distension. There is no tenderness.  Genitourinary:  Genitourinary Comments: There is some erythema and swelling of the glans and shaft of the penis. There is a purulent discharge from the urethra.  Musculoskeletal: Normal range of motion. He exhibits no edema.  Neurological: He is alert and oriented to person, place, and time. Coordination normal.  Skin: Skin is warm and dry. He is not diaphoretic.  Nursing note and vitals reviewed.    ED Treatments / Results  Labs (all labs ordered are listed, but only abnormal results are displayed) Labs Reviewed  BASIC METABOLIC PANEL - Abnormal; Notable for the following:       Result Value   Glucose, Bld 122 (*)    BUN 30 (*)    Calcium 8.6 (*)    All other components within normal limits  CBC  URINALYSIS, ROUTINE W REFLEX MICROSCOPIC  GC/CHLAMYDIA PROBE AMP ([place]) NOT AT [facility]    EKG  EKG Interpretation None       Radiology No results found.  Procedures Procedures (including critical care time)  Medications Ordered in ED Medications - No data to display   Initial Impression / Assessment and Plan / ED Course  I have reviewed the triage vital signs and the nursing notes.  Pertinent labs & imaging results that were available during my care of the patient were reviewed by me and considered in my medical decision making (see chart for details).  Patient presents with penile swelling and urethral discharge. His urinalysis shows white cells and large leukocytes. He will be given IM Rocephin and discharged on Keflex.  I am uncertain as to whether this is related to a urinary tract infection, prostatitis, or possibly and less likely an STD. GC and Chlamydia cultures are sent.  Final Clinical Impressions(s) / ED Diagnoses    Final diagnoses:  None    New Prescriptions New Prescriptions   No medications on file     Geoffery Lyons, MD 11/06/16 [phone]

## 2016-11-06 NOTE — ED Notes (Signed)
PT states understanding of care given, follow up care, and medication prescribed. PT ambulated from ED to car with a steady gait. 

## 2016-11-06 NOTE — Discharge Instructions (Signed)
Keflex as prescribed. ° °Return to the emergency department if your symptoms significantly worsen or change. °

## 2016-11-08 LAB — URINE CULTURE: Culture: NO GROWTH

## 2016-11-17 ENCOUNTER — Other Ambulatory Visit: Payer: Self-pay | Admitting: Family Medicine

## 2016-11-17 ENCOUNTER — Ambulatory Visit
Admission: RE | Admit: 2016-11-17 | Discharge: 2016-11-17 | Disposition: A | Discharge status: Home / Self Care | Payer: Medicare Other | Source: Ambulatory Visit | Attending: Family Medicine | Admitting: Family Medicine

## 2016-11-17 DIAGNOSIS — Z01818 Encounter for other preprocedural examination: Secondary | ICD-10-CM

## 2016-12-31 ENCOUNTER — Other Ambulatory Visit: Payer: Self-pay | Admitting: Cardiovascular Disease

## 2017-01-01 ENCOUNTER — Telehealth: Payer: Self-pay | Admitting: Cardiovascular Disease

## 2017-01-01 ENCOUNTER — Other Ambulatory Visit: Payer: Self-pay | Admitting: *Deleted

## 2017-01-01 MED ORDER — METOPROLOL TARTRATE 25 MG PO TABS: ORAL_TABLET | ORAL | 3 refills | Status: DC

## 2017-01-01 NOTE — Telephone Encounter (Signed)
Requested Prescriptions   Signed Prescriptions Disp Refills  . metoprolol tartrate (LOPRESSOR) 25 MG tablet 180 tablet 3    Sig: TAKE 1 TABLET (25 MG TOTAL) BY MOUTH 2 (TWO) TIMES DAILY.    Authorizing Provider: GOLLAN, TIMOTHY J    Ordering User: Sudais Banghart C    

## 2017-01-01 NOTE — Telephone Encounter (Signed)
Requested Prescriptions   Signed Prescriptions Disp Refills  . metoprolol tartrate (LOPRESSOR) 25 MG tablet 180 tablet 3    Sig: TAKE 1 TABLET (25 MG TOTAL) BY MOUTH 2 (TWO) TIMES DAILY.    Authorizing Provider: Antonieta IbaGOLLAN, TIMOTHY J    Ordering User: Kendrick FriesLOPEZ, MARINA C

## 2017-01-01 NOTE — Telephone Encounter (Signed)
°*  STAT* If patient is at the pharmacy, call can be transferred to refill team.   1. Which medications need to be refilled? (please list name of each medication and dose if known)  Metoprolol  2. Which pharmacy/location (including street and city if local pharmacy) is medication to be sent to? [facility] [address]  3. Do they need a 30 day or 90 day supply?  90 day

## 2017-02-12 ENCOUNTER — Telehealth: Payer: Self-pay | Admitting: Cardiovascular Disease

## 2017-02-12 ENCOUNTER — Other Ambulatory Visit: Payer: Self-pay

## 2017-02-12 MED ORDER — SIMVASTATIN 40 MG PO TABS: ORAL_TABLET | ORAL | 0 refills | Status: DC

## 2017-02-12 NOTE — Telephone Encounter (Signed)
Requested Prescriptions   Signed Prescriptions Disp Refills  . simvastatin (ZOCOR) 40 MG tablet 90 tablet 0    Sig: TAKE 1 TABLET BY MOUTH EVERYDAY AT BEDTIME    Authorizing Provider: Antonieta IbaGOLLAN, TIMOTHY J    Ordering User: Margrett RudSLAYTON, Tanica Gaige N

## 2017-02-12 NOTE — Telephone Encounter (Signed)
Requested Prescriptions   Signed Prescriptions Disp Refills  . simvastatin (ZOCOR) 40 MG tablet 90 tablet 0    Sig: TAKE 1 TABLET BY MOUTH EVERYDAY AT BEDTIME    Authorizing Provider: GOLLAN, TIMOTHY J    Ordering User: SLAYTON, BRITTANY N    

## 2017-02-12 NOTE — Telephone Encounter (Signed)
°*  STAT* If patient is at the pharmacy, call can be transferred to refill team.   1. Which medications need to be refilled? (please list name of each medication and dose if known) simvastatin   2. Which pharmacy/location (including street and city if local pharmacy) is medication to be sent to? [facility]   3. Do they need a 30 day or 90 day supply? 90 day

## 2017-02-12 NOTE — Telephone Encounter (Signed)
[facility]  

## 2017-02-12 NOTE — Telephone Encounter (Signed)
Pt calling stating he wants [facility], states he doesn't want [facility] anymore   *STAT* If patient is at the pharmacy, call can be transferred to refill team.   1. Which medications need to be refilled? (please list name of each medication and dose if known) simvastatin   2. Which pharmacy/location (including street and city if local pharmacy) is medication to be sent to? [facility] [place]   3. Do they need a 30 day or 90 day supply? 90 day

## 2017-05-05 ENCOUNTER — Other Ambulatory Visit: Payer: Self-pay | Admitting: Cardiovascular Disease

## 2017-08-25 ENCOUNTER — Other Ambulatory Visit: Payer: Self-pay | Admitting: Cardiovascular Disease

## 2017-09-26 ENCOUNTER — Ambulatory Visit: Payer: Medicare Other | Admitting: Cardiovascular Disease

## 2017-10-04 NOTE — Progress Notes (Signed)
Cardiology Office Note  Date:  10/05/2017   ID:  [name], [name] [birth year], MRN [id number]  PCP:  Kaleen Mask, MD   Chief Complaint  Patient presents with  . other    12 month f/u no complaints today.  Pt hasn't PCP in a while. Meds reviewed verbally with pt.    HPI:   [name] is a 77 yo gentleman with a h/o   coronary artery disease, HTN,  CABG in August of 2007 with a LIMA  to the LAD,  vein graft to the diagonal, and graft to the OM and PDA of the circumflex,  hypertension  hyperlipidemia.   Who presents for routine follow up of his coronary artery disease.  In follow-up today he is doing well Denies any chest pain concerning for angina Continues to work on heavy machinery, tractors, cars  [place] again, few months ago Was putting motor in the truck, hurt himself Slipped last year as well  Lost weight, watch his diet No problems with his medications  He is fasting today and requesting lab work  EKG personally reviewed by myself on todays visit Shows normal sinus rhythm with rate 56 bpm no significant ST or T wave changes  Other past medical history Previously had vertigo, improved with meclizine   PMH:   has a past medical history of Arthritis, CAD (coronary artery disease), History of kidney stones (late 70's early 80's), HLD (hyperlipidemia), HTN (hypertension), Myocardial infarction (HCC) (2008), and Positional vertigo.  PSH:    Past Surgical History:  Procedure Laterality Date  . bypass surgery (otheR)  August 2007  . COLONOSCOPY    . CORONARY ARTERY BYPASS GRAFT    . SHOULDER SURGERY    . TONSILLECTOMY      Current Outpatient Medications  Medication Sig Dispense Refill  . amLODipine (NORVASC) 5 MG tablet Take 5 mg by mouth daily.    Marland Kitchen aspirin EC 81 MG tablet Take 81 mg by mouth 2 (two) times daily.    . Bilberry 1000 MG CAPS Take 1 capsule by mouth daily.    Marland Kitchen ibuprofen (ADVIL,MOTRIN) 200 MG tablet Take 200 mg by mouth every 6  (six) hours as needed.    Marland Kitchen lisinopril (PRINIVIL,ZESTRIL) 20 MG tablet TAKE 1 TABLET (20 MG TOTAL) BY MOUTH DAILY. 90 tablet 3  . metoprolol tartrate (LOPRESSOR) 25 MG tablet TAKE 1 TABLET (25 MG TOTAL) BY MOUTH 2 (TWO) TIMES DAILY. 180 tablet 3  . Multiple Vitamins-Minerals (ONE-A-DAY EXTRAS ANTIOXIDANT) CAPS Take by mouth daily.      . niacin 500 MG tablet Take 500 mg by mouth at bedtime.    . simvastatin (ZOCOR) 40 MG tablet TAKE 1 TABLET BY MOUTH EVERYDAY AT BEDTIME 90 tablet 0  . vitamin E (VITAMIN E) 400 UNIT capsule Take 400 Units by mouth daily.     No current facility-administered medications for this visit.      Allergies:   Codeine   Social History:  The patient  reports that he has quit smoking. He has never used smokeless tobacco. He reports that he does not drink alcohol or use drugs.   Family History:   family history includes Alzheimer's disease in his father; Colon cancer in his father; Diabetes in his mother and sister; Prostate cancer in his brother and father.    Review of Systems: Review of Systems  Constitutional: Negative.   Respiratory: Negative.   Cardiovascular: Negative.   Gastrointestinal: Negative.   Musculoskeletal: Positive for back  pain and joint pain.  Neurological: Negative.   Psychiatric/Behavioral: Negative.   All other systems reviewed and are negative.    PHYSICAL EXAM: VS:  BP 110/72 (BP Location: Left Arm, Patient Position: Sitting, Cuff Size: Normal)   Pulse (!) 56   Ht 6' 2.5" (1.892 m)   Wt 202 lb 8 oz (91.9 kg)   BMI 25.65 kg/m  , BMI Body mass index is 25.65 kg/m. Constitutional:  oriented to person, place, and time. No distress.  HENT:  Head: Normocephalic and atraumatic.  Eyes:  no discharge. No scleral icterus.  Neck: Normal range of motion. Neck supple. No JVD present.  Cardiovascular: Normal rate, regular rhythm, normal heart sounds and intact distal pulses. Exam reveals no gallop and no friction rub. No edema No murmur  heard. Pulmonary/Chest: Effort normal and breath sounds normal. No stridor. No respiratory distress.  no wheezes.  no rales.  no tenderness.  Abdominal: Soft.  no distension.  no tenderness.  Musculoskeletal: Normal range of motion.  no  tenderness or deformity.  Neurological:  normal muscle tone. Coordination normal. No atrophy Skin: Skin is warm and dry. No rash noted. not diaphoretic.  Psychiatric:  normal mood and affect. behavior is normal. Thought content normal.    Recent Labs: 11/05/2016: BUN 30; Creatinine, Ser 1.00; Hemoglobin 14.3; Platelets 188; Potassium 4.1; Sodium 137    Lipid Panel Lab Results  Component Value Date   CHOL 143 06/15/2016   HDL 39 (L) 06/15/2016   LDLCALC 77 06/15/2016   TRIG 137 06/15/2016      Wt Readings from Last 3 Encounters:  10/05/17 202 lb 8 oz (91.9 kg)  11/05/16 207 lb (93.9 kg)  06/15/16 208 lb (94.3 kg)       ASSESSMENT AND PLAN:  Atherosclerosis of coronary artery bypass graft of native heart without angina pectoris - Plan: EKG 12-Lead Currently with no symptoms of angina. No further workup at this time. Continue current medication regimen.  Stable  HYPERTENSION, BENIGN - Plan: EKG 12-Lead Blood pressure is well controlled on today's visit. No changes made to the medications. Stable  Hyperlipidemia Cholesterol is at goal on the current lipid regimen. No changes to the medications were made. Labs drawn today at his request  Hip pain Further hip and back discomfort after recent injury, fall Also head injury 1 year prior   Total encounter time more than 25 minutes  Greater than 50% was spent in counseling and coordination of care with the patient   Disposition:   F/U  12 months   No orders of the defined types were placed in this encounter.    Signed, Dossie Arbour, M.D., Ph.D. 10/05/2017  [facility] [place], [place] [phone]

## 2017-10-05 ENCOUNTER — Encounter: Payer: Self-pay | Admitting: Cardiovascular Disease

## 2017-10-05 ENCOUNTER — Ambulatory Visit (INDEPENDENT_AMBULATORY_CARE_PROVIDER_SITE_OTHER): Payer: Medicare Other | Admitting: Cardiovascular Disease

## 2017-10-05 VITALS — BP 110/72 | HR 56 | Ht 74.5 in | Wt 202.5 lb

## 2017-10-05 DIAGNOSIS — M25552 Pain in left hip: Secondary | ICD-10-CM

## 2017-10-05 DIAGNOSIS — I25708 Atherosclerosis of coronary artery bypass graft(s), unspecified, with other forms of angina pectoris: Secondary | ICD-10-CM | POA: Diagnosis not present

## 2017-10-05 DIAGNOSIS — E782 Mixed hyperlipidemia: Secondary | ICD-10-CM | POA: Diagnosis not present

## 2017-10-05 DIAGNOSIS — I10 Essential (primary) hypertension: Secondary | ICD-10-CM

## 2017-10-05 MED ORDER — METOPROLOL TARTRATE 25 MG PO TABS: ORAL_TABLET | ORAL | 3 refills | Status: DC

## 2017-10-05 MED ORDER — AMLODIPINE BESYLATE 5 MG PO TABS: 5.0000 mg | ORAL_TABLET | Freq: Every day | ORAL | 3 refills | Status: DC

## 2017-10-05 MED ORDER — LISINOPRIL 20 MG PO TABS: ORAL_TABLET | ORAL | 3 refills | Status: DC

## 2017-10-05 MED ORDER — SIMVASTATIN 40 MG PO TABS: 40.0000 mg | ORAL_TABLET | Freq: Every day | ORAL | 3 refills | Status: DC

## 2017-10-05 NOTE — Patient Instructions (Addendum)
Medication Instructions:   No medication changes made  Labwork:  Lipids and liver today  Testing/Procedures:  No further testing at this time   Follow-Up: It was a pleasure seeing you in the office today. Please call us if you have new issues that need to be addressed before your next appt.  [phone]  Your physician wants you to follow-up in: 12 months.  You will receive a reminder letter in the mail two months in advance. If you don't receive a letter, please call our office to schedule the follow-up appointment.  If you need a refill on your cardiac medications before your next appointment, please call your pharmacy.  For educational health videos Log in to : [email or web address] Or : [email or web address], password : [email or web address]

## 2017-10-06 LAB — LIPID PANEL
CHOLESTEROL TOTAL: 135 mg/dL (ref 100–199)
Chol/HDL Ratio: 3.8 ratio (ref 0.0–5.0)
HDL: 36 mg/dL — ABNORMAL LOW (ref >39)
LDL Calculated: 80 mg/dL (ref 0–99)
TRIGLYCERIDES: 95 mg/dL (ref 0–149)
VLDL Cholesterol Cal: 19 mg/dL (ref 5–40)

## 2017-10-06 LAB — HEPATIC FUNCTION PANEL
ALK PHOS: 81 IU/L (ref 39–117)
ALT: 21 IU/L (ref 0–44)
AST: 20 IU/L (ref 0–40)
Albumin: 4.4 g/dL (ref 3.5–4.8)
Bilirubin Total: 0.8 mg/dL (ref 0.0–1.2)
Bilirubin, Direct: 0.21 mg/dL (ref 0.00–0.40)
TOTAL PROTEIN: 6.5 g/dL (ref 6.0–8.5)

## 2017-10-08 ENCOUNTER — Telehealth: Payer: Self-pay | Admitting: *Deleted

## 2017-10-08 MED ORDER — EZETIMIBE 10 MG PO TABS: 10.0000 mg | ORAL_TABLET | Freq: Every day | ORAL | 3 refills | Status: DC

## 2017-10-08 NOTE — Telephone Encounter (Signed)
-----   Message from Antonieta Iba, MD sent at 10/06/2017 11:28 AM EDT ----- LDL above goal Need <70 Would add zetia 10 mg daily Stay on simvastatin 40 daily

## 2017-10-08 NOTE — Telephone Encounter (Signed)
Reviewed results and recommendations with patient and he verbalized understanding with no further questions. Medication sent in and confirmed pharmacy of choice. He was appreciative for the call and no other concerns at this time.

## 2017-10-19 ENCOUNTER — Emergency Department (HOSPITAL_COMMUNITY)
Admission: EM | Admit: 2017-10-19 | Discharge: 2017-10-20 | Disposition: A | Discharge status: Home / Self Care | Payer: Medicare Other | Attending: Emergency Medicine | Admitting: Emergency Medicine

## 2017-10-19 ENCOUNTER — Encounter (HOSPITAL_COMMUNITY): Payer: Self-pay | Admitting: Emergency Medicine

## 2017-10-19 DIAGNOSIS — Z79899 Other long term (current) drug therapy: Secondary | ICD-10-CM | POA: Insufficient documentation

## 2017-10-19 DIAGNOSIS — I714 Abdominal aortic aneurysm, without rupture, unspecified: Secondary | ICD-10-CM

## 2017-10-19 DIAGNOSIS — R911 Solitary pulmonary nodule: Secondary | ICD-10-CM | POA: Diagnosis not present

## 2017-10-19 DIAGNOSIS — Z87891 Personal history of nicotine dependence: Secondary | ICD-10-CM | POA: Diagnosis not present

## 2017-10-19 DIAGNOSIS — R300 Dysuria: Secondary | ICD-10-CM | POA: Diagnosis present

## 2017-10-19 DIAGNOSIS — I252 Old myocardial infarction: Secondary | ICD-10-CM | POA: Diagnosis not present

## 2017-10-19 DIAGNOSIS — I2510 Atherosclerotic heart disease of native coronary artery without angina pectoris: Secondary | ICD-10-CM | POA: Diagnosis not present

## 2017-10-19 DIAGNOSIS — N390 Urinary tract infection, site not specified: Secondary | ICD-10-CM | POA: Insufficient documentation

## 2017-10-19 DIAGNOSIS — I10 Essential (primary) hypertension: Secondary | ICD-10-CM | POA: Diagnosis not present

## 2017-10-19 DIAGNOSIS — Z951 Presence of aortocoronary bypass graft: Secondary | ICD-10-CM | POA: Insufficient documentation

## 2017-10-19 HISTORY — DX: Calculus of kidney: N20.0

## 2017-10-19 LAB — URINALYSIS, ROUTINE W REFLEX MICROSCOPIC
Bilirubin Urine: NEGATIVE
Glucose, UA: NEGATIVE mg/dL
Ketones, ur: 5 mg/dL — AB
Nitrite: POSITIVE — AB
PH: 6 (ref 5.0–8.0)
Protein, ur: 100 mg/dL — AB
RBC / HPF: >50 RBC/hpf — ABNORMAL HIGH (ref 0–5)
SPECIFIC GRAVITY, URINE: 1.026 (ref 1.005–1.030)
WBC, UA: >50 WBC/hpf — ABNORMAL HIGH (ref 0–5)

## 2017-10-19 LAB — COMPREHENSIVE METABOLIC PANEL
ALBUMIN: 3.6 g/dL (ref 3.5–5.0)
ALT: 20 U/L (ref 0–44)
ANION GAP: 8 (ref 5–15)
AST: 21 U/L (ref 15–41)
Alkaline Phosphatase: 60 U/L (ref 38–126)
BILIRUBIN TOTAL: 0.9 mg/dL (ref 0.3–1.2)
BUN: 21 mg/dL (ref 8–23)
CO2: 23 mmol/L (ref 22–32)
Calcium: 8.6 mg/dL — ABNORMAL LOW (ref 8.9–10.3)
Chloride: 108 mmol/L (ref 98–111)
Creatinine, Ser: 0.97 mg/dL (ref 0.61–1.24)
GFR calc Af Amer: >60 mL/min (ref >60)
Glucose, Bld: 123 mg/dL — ABNORMAL HIGH (ref 70–99)
POTASSIUM: 4.2 mmol/L (ref 3.5–5.1)
Sodium: 139 mmol/L (ref 135–145)
TOTAL PROTEIN: 6.1 g/dL — AB (ref 6.5–8.1)

## 2017-10-19 LAB — CBC WITH DIFFERENTIAL/PLATELET
Abs Immature Granulocytes: 0.2 10*3/uL — ABNORMAL HIGH (ref 0.0–0.1)
BASOS PCT: 0 %
Basophils Absolute: 0.1 10*3/uL (ref 0.0–0.1)
EOS ABS: 0 10*3/uL (ref 0.0–0.7)
Eosinophils Relative: 0 %
HEMATOCRIT: 43.1 % (ref 39.0–52.0)
Hemoglobin: 14.2 g/dL (ref 13.0–17.0)
IMMATURE GRANULOCYTES: 1 %
LYMPHS ABS: 0.5 10*3/uL — AB (ref 0.7–4.0)
Lymphocytes Relative: 3 %
MCH: 31.3 pg (ref 26.0–34.0)
MCHC: 32.9 g/dL (ref 30.0–36.0)
MCV: 94.9 fL (ref 78.0–100.0)
Monocytes Absolute: 1.2 10*3/uL — ABNORMAL HIGH (ref 0.1–1.0)
Monocytes Relative: 7 %
NEUTROS PCT: 89 %
Neutro Abs: 14.3 10*3/uL — ABNORMAL HIGH (ref 1.7–7.7)
Platelets: 164 10*3/uL (ref 150–400)
RBC: 4.54 MIL/uL (ref 4.22–5.81)
RDW: 13.5 % (ref 11.5–15.5)
WBC: 16.2 10*3/uL — AB (ref 4.0–10.5)

## 2017-10-19 NOTE — ED Triage Notes (Signed)
Patient reports dysuria " it burns" with dark/concentrated urine onset today , denies fever or chills .

## 2017-10-19 NOTE — ED Notes (Signed)
Called pt name 3x with no answer

## 2017-10-19 NOTE — ED Notes (Signed)
Called pt name for vitals recheck with no answer again

## 2017-10-20 ENCOUNTER — Emergency Department (HOSPITAL_COMMUNITY): Payer: Medicare Other

## 2017-10-20 DIAGNOSIS — N390 Urinary tract infection, site not specified: Secondary | ICD-10-CM | POA: Diagnosis not present

## 2017-10-20 LAB — I-STAT CG4 LACTIC ACID, ED: LACTIC ACID, VENOUS: 1.17 mmol/L (ref 0.5–1.9)

## 2017-10-20 MED ORDER — CEFTRIAXONE SODIUM 2 G IJ SOLR
2.0000 g | Freq: Once | INTRAMUSCULAR | Status: AC
  Administered 2017-10-20: 2 g via INTRAVENOUS
  Filled 2017-10-20: qty 20

## 2017-10-20 MED ORDER — SODIUM CHLORIDE 0.9 % IV BOLUS
1000.0000 mL | Freq: Once | INTRAVENOUS | Status: AC
  Administered 2017-10-20: 1000 mL via INTRAVENOUS

## 2017-10-20 MED ORDER — CEFPODOXIME PROXETIL 200 MG PO TABS: 200.0000 mg | ORAL_TABLET | Freq: Two times a day (BID) | ORAL | 0 refills | Status: AC

## 2017-10-20 MED ORDER — ONDANSETRON 4 MG PO TBDP: 4.0000 mg | ORAL_TABLET | Freq: Three times a day (TID) | ORAL | 0 refills | Status: DC | PRN

## 2017-10-20 NOTE — ED Notes (Signed)
Painful urination for 3-4 days with bloody urine  Lower abd and penis pain

## 2017-10-20 NOTE — ED Notes (Signed)
Pt in lobby, reports he never left ED but stepped outside for a while.

## 2017-10-20 NOTE — Discharge Instructions (Addendum)
If you develop fever, nausea, vomiting, or other concerning symptoms, return to the ER immediately for admission.  Take the antibiotic as prescribed.  He can start taking this when you are discharged.  Of note, your CT scan did show an incidental abdominal aortic aneurysm as well as pulmonary nodules.  These need follow-up with your primary doctor but are not causing your symptoms currently.

## 2017-10-20 NOTE — ED Provider Notes (Signed)
[facility] EMERGENCY DEPARTMENT Provider Note   CSN: [id number] Arrival date & time: 10/19/17  2014     History   Chief Complaint Chief Complaint  Patient presents with  . Dysuria    Hematuria    HPI JACQUISE RARICK is a 77 y.o. male.  HPI    77 year old male with past medical history of coronary disease, hypertension, hyperlipidemia, here with dysuria.  The patient states that for the last several days, he has developed progressively worsening dysuria.  He describes it as a stinging, burning sensation whenever he urinates.  He has some associated lower abdominal fullness.  He denies any associated fever, nausea, vomiting, or flank pain.  The patient states that he has a history of kidney stones as well as history of UTIs with similar symptoms.  He has not recently been on any antibiotics.  He states he has not had any fevers or chills.  Otherwise has felt generally well.  He does have some chronic urinary frequency, but states he has prostate issues.  He has previously seen urology but has not seen them in at least the last 10 years as he has not had any recurrence or worsening of symptoms.  Denies any high-risk sexual activity.  Denies any change in his bowel habits.  No other complaints.  Symptoms are not painful when he is not urinating.  Past Medical History:  Diagnosis Date  . Arthritis   . CAD (coronary artery disease)    CABG 2007 with LIMA-LAD, SVG-D, sequential SVG-OM and CFX PDA. Myoview 12/09 was probably a negative study with attenuation artifact  . History of kidney stones late 22's early 55's  . HLD (hyperlipidemia)   . HTN (hypertension)   . Kidney stones   . Myocardial infarction (HCC) 2008   light  . Positional vertigo     Patient Active Problem List   Diagnosis Date Noted  . Pain of left hip joint 06/15/2016  . POSITIONAL VERTIGO 05/14/2009  . Hyperlipidemia 03/03/2009  . HYPERTENSION, BENIGN 03/03/2009  . CORONARY ATHEROSCLEROSIS OF  ARTERY BYPASS GRAFT 03/03/2009    Past Surgical History:  Procedure Laterality Date  . bypass surgery (otheR)  August 2007  . COLONOSCOPY    . CORONARY ARTERY BYPASS GRAFT    . SHOULDER SURGERY    . TONSILLECTOMY          Home Medications    Prior to Admission medications   Medication Sig Start Date End Date Taking? Authorizing Provider  amLODipine (NORVASC) 5 MG tablet Take 1 tablet (5 mg total) by mouth daily. 10/05/17  Yes Antonieta Iba, MD  aspirin EC 81 MG tablet Take 1 tablet (81 mg total) by mouth daily. 10/05/17  Yes Gollan, Tollie Pizza, MD  Bilberry 1000 MG CAPS Take 1 capsule by mouth daily.   Yes [provider]  ezetimibe (ZETIA) 10 MG tablet Take 1 tablet (10 mg total) by mouth daily. 10/08/17 01/06/18 Yes Gollan, Tollie Pizza, MD  ibuprofen (ADVIL,MOTRIN) 200 MG tablet Take 200 mg by mouth every 6 (six) hours as needed for headache or moderate pain.    Yes [provider]  lisinopril (PRINIVIL,ZESTRIL) 20 MG tablet TAKE 1 TABLET (20 MG TOTAL) BY MOUTH DAILY. Patient taking differently: Take 20 mg by mouth daily.  10/05/17  Yes Gollan, Tollie Pizza, MD  metoprolol tartrate (LOPRESSOR) 25 MG tablet TAKE 1 TABLET (25 MG TOTAL) BY MOUTH 2 (TWO) TIMES DAILY. Patient taking differently: Take 25 mg by mouth  2 (two) times daily.  10/05/17  Yes Gollan, Tollie Pizza, MD  Multiple Vitamins-Minerals (ONE-A-DAY EXTRAS ANTIOXIDANT) CAPS Take by mouth daily.     Yes [provider]  simvastatin (ZOCOR) 40 MG tablet Take 1 tablet (40 mg total) by mouth at bedtime. 10/05/17  Yes Gollan, Tollie Pizza, MD  vitamin E (VITAMIN E) 400 UNIT capsule Take 400 Units by mouth daily.   Yes [provider]  cefpodoxime (VANTIN) 200 MG tablet Take 1 tablet (200 mg total) by mouth 2 (two) times daily for 10 days. 10/20/17 10/30/17  Shaune Pollack, MD  ondansetron (ZOFRAN ODT) 4 MG disintegrating tablet Take 1 tablet (4 mg total) by mouth every 8 (eight) hours as needed for nausea or  vomiting. 10/20/17   Shaune Pollack, MD    Family History Family History  Problem Relation Age of Onset  . Alzheimer's disease Father   . Colon cancer Father   . Prostate cancer Father   . Diabetes Sister   . Prostate cancer Brother   . Diabetes Mother     Social History Social History   Tobacco Use  . Smoking status: Former Games developer  . Smokeless tobacco: Never Used  Substance Use Topics  . Alcohol use: No  . Drug use: No     Allergies   Codeine   Review of Systems Review of Systems  Constitutional: Negative for chills, fatigue and fever.  HENT: Negative for congestion and rhinorrhea.   Eyes: Negative for visual disturbance.  Respiratory: Negative for cough, shortness of breath and wheezing.   Cardiovascular: Negative for chest pain and leg swelling.  Gastrointestinal: Negative for abdominal pain, diarrhea, nausea and vomiting.  Genitourinary: Positive for dysuria, hematuria and penile pain. Negative for flank pain.  Musculoskeletal: Negative for neck pain and neck stiffness.  Skin: Negative for rash and wound.  Allergic/Immunologic: Negative for immunocompromised state.  Neurological: Positive for weakness. Negative for syncope and headaches.  All other systems reviewed and are negative.    Physical Exam Updated Vital Signs BP 123/71   Pulse 79   Temp 98.7 F (37.1 C) (Oral)   Resp 16   SpO2 95%   Physical Exam  Constitutional: He is oriented to person, place, and time. He appears well-developed and well-nourished. No distress.  HENT:  Head: Normocephalic and atraumatic.  Mouth/Throat: Oropharynx is clear and moist.  Eyes: Conjunctivae are normal.  Neck: Neck supple.  Cardiovascular: Normal rate, regular rhythm and normal heart sounds. Exam reveals no friction rub.  No murmur heard. Pulmonary/Chest: Effort normal and breath sounds normal. No respiratory distress. He has no wheezes. He has no rales.  Abdominal: Soft. Bowel sounds are normal. He exhibits  no distension. There is no tenderness. There is no guarding.  No CVA tenderness bilaterally  Genitourinary:  Genitourinary Comments: No penile discharge or lesions.  Testes descended bilaterally and nontender.    Musculoskeletal: He exhibits no edema.  Neurological: He is alert and oriented to person, place, and time. He exhibits normal muscle tone.  Skin: Skin is warm. Capillary refill takes less than 2 seconds.  Psychiatric: He has a normal mood and affect.  Nursing note and vitals reviewed.    ED Treatments / Results  Labs (all labs ordered are listed, but only abnormal results are displayed) Labs Reviewed  CBC WITH DIFFERENTIAL/PLATELET - Abnormal; Notable for the following components:      Result Value   WBC 16.2 (*)    Neutro Abs 14.3 (*)    Lymphs Abs  0.5 (*)    Monocytes Absolute 1.2 (*)    Abs Immature Granulocytes 0.2 (*)    All other components within normal limits  COMPREHENSIVE METABOLIC PANEL - Abnormal; Notable for the following components:   Glucose, Bld 123 (*)    Calcium 8.6 (*)    Total Protein 6.1 (*)    All other components within normal limits  URINALYSIS, ROUTINE W REFLEX MICROSCOPIC - Abnormal; Notable for the following components:   APPearance HAZY (*)    Hgb urine dipstick LARGE (*)    Ketones, ur 5 (*)    Protein, ur 100 (*)    Nitrite POSITIVE (*)    Leukocytes, UA SMALL (*)    RBC / HPF >50 (*)    WBC, UA >50 (*)    Bacteria, UA MANY (*)    All other components within normal limits  URINE CULTURE  CULTURE, BLOOD (ROUTINE X 2)  CULTURE, BLOOD (ROUTINE X 2)  I-STAT CG4 LACTIC ACID, ED  I-STAT CG4 LACTIC ACID, ED    EKG None  Radiology Ct Renal Stone Study  Result Date: 10/20/2017 CLINICAL DATA:  Low abdominal pain with painful urination for 3 days. History of kidney stones. EXAM: CT ABDOMEN AND PELVIS WITHOUT CONTRAST TECHNIQUE: Multidetector CT imaging of the abdomen and pelvis was performed following the standard protocol without IV  contrast. COMPARISON:  Abdominal ultrasound 07/03/2008. Report from abdominal CT 12/13/1998. FINDINGS: Lower chest: There are small nodules at both lung bases, including a 4 x 6 mm right lower lobe nodule on image 7/5, an 8 x 5 mm right lower lobe nodule on image 29/5 and an 8 x 5 mm left lower lobe nodule on image 16/5. Mild underlying emphysematous changes are present. There is atherosclerosis of the aorta and coronary arteries. No significant pleural or pericardial effusion. Hepatobiliary: Borderline hepatic steatosis without focal abnormality on noncontrast imaging. No evidence of gallstones, gallbladder wall thickening or biliary dilatation. Pancreas: Atrophied without focal abnormality or surrounding inflammation. Spleen: Normal in size without focal abnormality. Adrenals/Urinary Tract: Both adrenal glands appear normal. Small central calcifications in both kidneys are likely renovascular. No definite renal calculus. There is no ureteral calculus or hydronephrosis. There is mild bladder wall thickening and mild perivesical soft tissue stranding. Stomach/Bowel: No evidence of bowel wall thickening, distention or surrounding inflammatory change. The appendix appears normal. There is a small hiatal hernia. Moderate diverticular changes are present throughout the descending and sigmoid colon. Vascular/Lymphatic: There are no enlarged abdominal or pelvic lymph nodes. There is diffuse aortic and branch vessel atherosclerosis with aneurysmal dilatation of the infrarenal aorta, demonstrating a maximal AP diameter of 3.7 cm on sagittal image 64/7. Reproductive: The prostate gland is moderately enlarged. There are central dystrophic calcifications. There is soft tissue stranding in the pelvic fat surrounding the seminal vesicles, prostate gland and bladder. No extraluminal fluid or air collection identified. Other: Small umbilical hernia containing only fat.  No ascites. Musculoskeletal: No acute or significant osseous  findings. Relatively mild multilevel spondylosis. IMPRESSION: 1. No evidence of ureteral calculus or hydronephrosis. There are no definite renal calculi. Central calcifications in both kidneys are likely renovascular. 2. Soft tissue stranding in the pelvis associated with bladder wall thickening and prostatomegaly. Findings suggest cystitis or prostatitis. Correlate clinically. 3. Small nodules at both lung bases. Non-contrast chest CT at 3-6 months is recommended. If the nodules are stable at time of repeat CT, then future CT at 18-24 months (from today's scan) is considered optional for low-risk patients, but  is recommended for high-risk patients. This recommendation follows the consensus statement: Guidelines for Management of Incidental Pulmonary Nodules Detected on CT Images: From the Fleischner Society 2017; Radiology 2017; 284:228-243. 4. Coronary and Aortic Atherosclerosis (ICD10-I70.0). 3.7 cm abdominal aortic aneurysm. Recommend followup by ultrasound in 2 years. This recommendation follows ACR consensus guidelines: White Paper of the ACR Incidental Findings Committee II on Vascular Findings. J Am Coll Radiol 2013; 10:789-794. 5. Diverticular changes throughout the distal colon without evidence of acute inflammation. Electronically Signed   By: Carey BullocksWilliam  Veazey M.D.   On: 10/20/2017 08:27    Procedures Procedures (including critical care time)  Medications Ordered in ED Medications  sodium chloride 0.9 % bolus 1,000 mL (1,000 mLs Intravenous New Bag/Given 10/20/17 0738)  cefTRIAXone (ROCEPHIN) 2 g in sodium chloride 0.9 % 100 mL IVPB (2 g Intravenous New Bag/Given 10/20/17 0807)     Initial Impression / Assessment and Plan / ED Course  I have reviewed the triage vital signs and the nursing notes.  Pertinent labs & imaging results that were available during my care of the patient were reviewed by me and considered in my medical decision making (see chart for details).     77 year old male  here with dysuria and frequency.  Lab work from waiting room is significant for leukocytosis and UTI with positive nitrites.  On exam, the patient is afebrile and remarkably well-appearing with no abdominal or CVA tenderness.  He has a history of previous UTIs.  Will check CT scan.  CT scan negative for stone.  There is soft tissue stranding about the bladder wall consistent with cystitis.  Prostate is enlarged which is a known issue.  Given his significant pyuria, I am hesitant to perform prostate exam.  His history and clinical course is more consistent with complicated UTI.  Will place the patient on longer course of antibiotics given his age.  Given absence of any nausea, vomiting, fever, flank pain, and stable vitals and remarkably well appearance, I discussed management options with the patient in detail.  He would like to manage as an outpatient at this time which I think is reasonable.  Will give him appropriate coverage, send cultures, and discharge with very good return precautions and PCP follow-up.  Final Clinical Impressions(s) / ED Diagnoses   Final diagnoses:  Lower urinary tract infectious disease  Abdominal aortic aneurysm (AAA) without rupture [facility](HCC)  Pulmonary nodule    ED Discharge Orders         Ordered    ondansetron (ZOFRAN ODT) 4 MG disintegrating tablet  Every 8 hours PRN     10/20/17 1001    cefpodoxime (VANTIN) 200 MG tablet  2 times daily     10/20/17 1001           Shaune PollackIsaacs, Slyvester Latona, MD 10/20/17 1001

## 2017-10-22 LAB — URINE CULTURE

## 2017-10-23 ENCOUNTER — Telehealth: Payer: Self-pay | Admitting: Emergency Medicine

## 2017-10-23 NOTE — Telephone Encounter (Signed)
Post ED Visit - Positive Culture Follow-up  Culture report reviewed by antimicrobial stewardship pharmacist:  []  Enzo BiNathan Batchelder, Pharm.D. []  Celedonio MiyamotoJeremy Frens, Pharm.D., BCPS AQ-ID []  Garvin FilaMike Maccia, Pharm.D., BCPS []  Georgina PillionElizabeth Martin, Pharm.D., BCPS []  [place]h Pham, [place]Pharm.D., BCPS, AAHIVP []  Estella HuskMichelle Turner, Pharm.D., BCPS, AAHIVP []  Lysle Pearlachel Rumbarger, PharmD, BCPS []  Phillips Climeshuy Dang, PharmD, BCPS [x]  Agapito GamesAlison Masters, PharmD, BCPS []  Verlan FriendsErin Deja, PharmD  Positive urine culture Treated with cefpodoxime, organism sensitive to the same and no further patient follow-up is required at this time.  Berle MullMiller, Adaleen Hulgan 10/23/2017, 11:43 AM

## 2017-10-25 LAB — CULTURE, BLOOD (ROUTINE X 2)
Culture: NO GROWTH
Culture: NO GROWTH

## 2018-02-15 ENCOUNTER — Encounter (HOSPITAL_COMMUNITY): Payer: Self-pay | Admitting: Emergency Medicine

## 2018-02-15 ENCOUNTER — Emergency Department (HOSPITAL_COMMUNITY): Payer: Medicare Other

## 2018-02-15 ENCOUNTER — Emergency Department (HOSPITAL_COMMUNITY)
Admission: EM | Admit: 2018-02-15 | Discharge: 2018-02-15 | Disposition: A | Discharge status: Home / Self Care | Payer: Medicare Other | Attending: Emergency Medicine | Admitting: Emergency Medicine

## 2018-02-15 DIAGNOSIS — I10 Essential (primary) hypertension: Secondary | ICD-10-CM | POA: Insufficient documentation

## 2018-02-15 DIAGNOSIS — Z87891 Personal history of nicotine dependence: Secondary | ICD-10-CM | POA: Insufficient documentation

## 2018-02-15 DIAGNOSIS — Y93H2 Activity, gardening and landscaping: Secondary | ICD-10-CM | POA: Diagnosis not present

## 2018-02-15 DIAGNOSIS — Z951 Presence of aortocoronary bypass graft: Secondary | ICD-10-CM | POA: Diagnosis not present

## 2018-02-15 DIAGNOSIS — L03116 Cellulitis of left lower limb: Secondary | ICD-10-CM | POA: Diagnosis not present

## 2018-02-15 DIAGNOSIS — Y33XXXA Other specified events, undetermined intent, initial encounter: Secondary | ICD-10-CM | POA: Insufficient documentation

## 2018-02-15 DIAGNOSIS — S8012XA Contusion of left lower leg, initial encounter: Secondary | ICD-10-CM | POA: Insufficient documentation

## 2018-02-15 DIAGNOSIS — Z7982 Long term (current) use of aspirin: Secondary | ICD-10-CM | POA: Diagnosis not present

## 2018-02-15 DIAGNOSIS — Z23 Encounter for immunization: Secondary | ICD-10-CM | POA: Diagnosis not present

## 2018-02-15 DIAGNOSIS — I252 Old myocardial infarction: Secondary | ICD-10-CM | POA: Insufficient documentation

## 2018-02-15 DIAGNOSIS — Y999 Unspecified external cause status: Secondary | ICD-10-CM | POA: Diagnosis not present

## 2018-02-15 DIAGNOSIS — S8992XA Unspecified injury of left lower leg, initial encounter: Secondary | ICD-10-CM | POA: Diagnosis present

## 2018-02-15 DIAGNOSIS — I2510 Atherosclerotic heart disease of native coronary artery without angina pectoris: Secondary | ICD-10-CM | POA: Insufficient documentation

## 2018-02-15 DIAGNOSIS — Y92007 Garden or yard of unspecified non-institutional (private) residence as the place of occurrence of the external cause: Secondary | ICD-10-CM | POA: Insufficient documentation

## 2018-02-15 DIAGNOSIS — M79605 Pain in left leg: Secondary | ICD-10-CM

## 2018-02-15 LAB — CBC WITH DIFFERENTIAL/PLATELET
Abs Immature Granulocytes: 0.03 10*3/uL (ref 0.00–0.07)
BASOS PCT: 0 %
Basophils Absolute: 0 10*3/uL (ref 0.0–0.1)
Eosinophils Absolute: 0.3 10*3/uL (ref 0.0–0.5)
Eosinophils Relative: 5 %
HEMATOCRIT: 44 % (ref 39.0–52.0)
Hemoglobin: 14.6 g/dL (ref 13.0–17.0)
Immature Granulocytes: 0 %
LYMPHS ABS: 1.1 10*3/uL (ref 0.7–4.0)
Lymphocytes Relative: 16 %
MCH: 31.9 pg (ref 26.0–34.0)
MCHC: 33.2 g/dL (ref 30.0–36.0)
MCV: 96.1 fL (ref 80.0–100.0)
MONO ABS: 0.6 10*3/uL (ref 0.1–1.0)
MONOS PCT: 9 %
NEUTROS ABS: 4.7 10*3/uL (ref 1.7–7.7)
NEUTROS PCT: 70 %
Platelets: 216 10*3/uL (ref 150–400)
RBC: 4.58 MIL/uL (ref 4.22–5.81)
RDW: 13.5 % (ref 11.5–15.5)
WBC: 6.7 10*3/uL (ref 4.0–10.5)
nRBC: 0 % (ref 0.0–0.2)

## 2018-02-15 LAB — COMPREHENSIVE METABOLIC PANEL
ALBUMIN: 3.7 g/dL (ref 3.5–5.0)
ALK PHOS: 54 U/L (ref 38–126)
ALT: 37 U/L (ref 0–44)
ANION GAP: 8 (ref 5–15)
AST: 53 U/L — AB (ref 15–41)
BUN: 19 mg/dL (ref 8–23)
CALCIUM: 8.6 mg/dL — AB (ref 8.9–10.3)
CO2: 25 mmol/L (ref 22–32)
Chloride: 107 mmol/L (ref 98–111)
Creatinine, Ser: 1.17 mg/dL (ref 0.61–1.24)
GFR calc Af Amer: >60 mL/min (ref >60)
GFR calc non Af Amer: 59 mL/min — ABNORMAL LOW (ref >60)
GLUCOSE: 176 mg/dL — AB (ref 70–99)
POTASSIUM: 4.2 mmol/L (ref 3.5–5.1)
SODIUM: 140 mmol/L (ref 135–145)
TOTAL PROTEIN: 6 g/dL — AB (ref 6.5–8.1)
Total Bilirubin: 1 mg/dL (ref 0.3–1.2)

## 2018-02-15 MED ORDER — BACITRACIN-NEOMYCIN-POLYMYXIN 400-5-5000 EX OINT: 1.0000 "application " | TOPICAL_OINTMENT | Freq: Two times a day (BID) | CUTANEOUS | 0 refills | Status: DC

## 2018-02-15 MED ORDER — ACETAMINOPHEN 325 MG PO TABS
650.0000 mg | ORAL_TABLET | Freq: Once | ORAL | Status: AC
  Administered 2018-02-15: 650 mg via ORAL
  Filled 2018-02-15: qty 2

## 2018-02-15 MED ORDER — KETOROLAC TROMETHAMINE 30 MG/ML IJ SOLN
30.0000 mg | Freq: Once | INTRAMUSCULAR | Status: AC
  Administered 2018-02-15: 30 mg via INTRAMUSCULAR
  Filled 2018-02-15: qty 1

## 2018-02-15 MED ORDER — CEPHALEXIN 500 MG PO CAPS: 500.0000 mg | ORAL_CAPSULE | Freq: Four times a day (QID) | ORAL | 0 refills | Status: DC

## 2018-02-15 MED ORDER — NAPROXEN 375 MG PO TABS: 375.0000 mg | ORAL_TABLET | Freq: Two times a day (BID) | ORAL | 0 refills | Status: DC

## 2018-02-15 MED ORDER — FENTANYL CITRATE (PF) 100 MCG/2ML IJ SOLN
75.0000 ug | Freq: Once | INTRAMUSCULAR | Status: DC
  Filled 2018-02-15: qty 2

## 2018-02-15 MED ORDER — TETANUS-DIPHTH-ACELL PERTUSSIS 5-2.5-18.5 LF-MCG/0.5 IM SUSP
0.5000 mL | Freq: Once | INTRAMUSCULAR | Status: AC
  Administered 2018-02-15: 0.5 mL via INTRAMUSCULAR
  Filled 2018-02-15: qty 0.5

## 2018-02-15 MED ORDER — ACETAMINOPHEN ER 650 MG PO TBCR: 650.0000 mg | EXTENDED_RELEASE_TABLET | Freq: Three times a day (TID) | ORAL | 0 refills | Status: AC | PRN

## 2018-02-15 MED ORDER — DOXYCYCLINE HYCLATE 100 MG PO TABS
100.0000 mg | ORAL_TABLET | Freq: Once | ORAL | Status: AC
  Administered 2018-02-15: 100 mg via ORAL
  Filled 2018-02-15: qty 1

## 2018-02-15 MED ORDER — DOXYCYCLINE HYCLATE 100 MG PO CAPS: 100.0000 mg | ORAL_CAPSULE | Freq: Two times a day (BID) | ORAL | 0 refills | Status: DC

## 2018-02-15 NOTE — Discharge Instructions (Addendum)
It seems like you have cellulitis/infection of your leg. You likely also have contusion.  Please take the medications prescribed for the infection.  The swelling should come down over time.  Heat therapy and ice therapy might also help.  Keep the area clean and dry, apply bacitracin ointment daily and take the medications provided. RETURN TO THE ER IF THERE IS INCREASED PAIN, REDNESS, PUS COMING OUT from the wound site.

## 2018-02-15 NOTE — ED Notes (Signed)
telfa pad and curlex applied to L leg

## 2018-02-15 NOTE — ED Provider Notes (Signed)
[facility] EMERGENCY DEPARTMENT Provider Note   CSN: [id number] Arrival date & time: 02/15/18  0414     History   Chief Complaint Chief Complaint  Patient presents with  . Leg Injury    HPI [name] is a 78 y.o. male.  HPI  78 year old male comes in with chief complaint of leg injury.  Patient reports that about 4 days ago he was cutting a tree limb when he injured his left leg.  Since that time he has been taking care of the wound himself, however last night he was unable to sleep because of severe pain and decided to come to the ER.  He is also noted increased swelling over his leg since the injury.  Patient denies any associated nausea, vomiting, fevers, chills.  He is able to ambulate.  The pain is described as burning type pain, there is no drainage noted from the wound site.  Past Medical History:  Diagnosis Date  . Arthritis   . CAD (coronary artery disease)    CABG 2007 with LIMA-LAD, SVG-D, sequential SVG-OM and CFX PDA. Myoview 12/09 was probably a negative study with attenuation artifact  . History of kidney stones late 61's early 38's  . HLD (hyperlipidemia)   . HTN (hypertension)   . Kidney stones   . Myocardial infarction (HCC) 2008   light  . Positional vertigo     Patient Active Problem List   Diagnosis Date Noted  . Pain of left hip joint 06/15/2016  . POSITIONAL VERTIGO 05/14/2009  . Hyperlipidemia 03/03/2009  . HYPERTENSION, BENIGN 03/03/2009  . CORONARY ATHEROSCLEROSIS OF ARTERY BYPASS GRAFT 03/03/2009    Past Surgical History:  Procedure Laterality Date  . bypass surgery (otheR)  August 2007  . COLONOSCOPY    . CORONARY ARTERY BYPASS GRAFT    . SHOULDER SURGERY    . TONSILLECTOMY          Home Medications    Prior to Admission medications   Medication Sig Start Date End Date Taking? Authorizing Provider  acetaminophen (TYLENOL 8 HOUR) 650 MG CR tablet Take 1 tablet (650 mg total) by mouth every 8 (eight) hours  as needed for pain or fever. 02/15/18   Derwood Kaplan, MD  amLODipine (NORVASC) 5 MG tablet Take 1 tablet (5 mg total) by mouth daily. 10/05/17   Antonieta Iba, MD  aspirin EC 81 MG tablet Take 1 tablet (81 mg total) by mouth daily. 10/05/17   Antonieta Iba, MD  Bilberry 1000 MG CAPS Take 1 capsule by mouth daily.    [provider]  cephALEXin (KEFLEX) 500 MG capsule Take 1 capsule (500 mg total) by mouth 4 (four) times daily. 02/15/18   Derwood Kaplan, MD  doxycycline (VIBRAMYCIN) 100 MG capsule Take 1 capsule (100 mg total) by mouth 2 (two) times daily. 02/15/18   Derwood Kaplan, MD  ezetimibe (ZETIA) 10 MG tablet Take 1 tablet (10 mg total) by mouth daily. 10/08/17 01/06/18  Antonieta Iba, MD  ibuprofen (ADVIL,MOTRIN) 200 MG tablet Take 200 mg by mouth every 6 (six) hours as needed for headache or moderate pain.     [provider]  lisinopril (PRINIVIL,ZESTRIL) 20 MG tablet TAKE 1 TABLET (20 MG TOTAL) BY MOUTH DAILY. Patient taking differently: Take 20 mg by mouth daily.  10/05/17   Antonieta Iba, MD  metoprolol tartrate (LOPRESSOR) 25 MG tablet TAKE 1 TABLET (25 MG TOTAL) BY MOUTH 2 (TWO) TIMES DAILY. Patient taking differently:  Take 25 mg by mouth 2 (two) times daily.  10/05/17   Antonieta IbaGollan, Timothy J, MD  Multiple Vitamins-Minerals (ONE-A-DAY EXTRAS ANTIOXIDANT) CAPS Take by mouth daily.      [provider]  naproxen (NAPROSYN) 375 MG tablet Take 1 tablet (375 mg total) by mouth 2 (two) times daily. 02/15/18   Derwood KaplanNanavati, Lyndel Sarate, MD  neomycin-bacitracin-polymyxin (NEOSPORIN) ointment Apply 1 application topically every 12 (twelve) hours. 02/15/18   Derwood KaplanNanavati, Dannell Gortney, MD  ondansetron (ZOFRAN ODT) 4 MG disintegrating tablet Take 1 tablet (4 mg total) by mouth every 8 (eight) hours as needed for nausea or vomiting. 10/20/17   Shaune PollackIsaacs, Cameron, MD  simvastatin (ZOCOR) 40 MG tablet Take 1 tablet (40 mg total) by mouth at bedtime. 10/05/17   Antonieta IbaGollan, Timothy J, MD  vitamin E  (VITAMIN E) 400 UNIT capsule Take 400 Units by mouth daily.    [provider]    Family History Family History  Problem Relation Age of Onset  . Alzheimer's disease Father   . Colon cancer Father   . Prostate cancer Father   . Diabetes Sister   . Prostate cancer Brother   . Diabetes Mother     Social History Social History   Tobacco Use  . Smoking status: Former Games developermoker  . Smokeless tobacco: Never Used  Substance Use Topics  . Alcohol use: No  . Drug use: No     Allergies   Codeine   Review of Systems Review of Systems  Constitutional: Positive for activity change. Negative for fever.  Gastrointestinal: Negative for nausea and vomiting.  Musculoskeletal: Positive for myalgias.  Skin: Positive for rash.     Physical Exam Updated Vital Signs BP 109/61   Pulse 82   Temp 97.8 F (36.6 C) (Oral)   Resp 16   Ht 6\' 1"  (1.854 m)   Wt 90.7 kg   SpO2 99%   BMI 26.39 kg/m   Physical Exam Vitals signs and nursing note reviewed.  Constitutional:      Appearance: He is well-developed.  HENT:     Head: Atraumatic.  Neck:     Musculoskeletal: Neck supple.  Cardiovascular:     Rate and Rhythm: Normal rate.  Pulmonary:     Effort: Pulmonary effort is normal.  Musculoskeletal:        General: Tenderness present.     Left lower leg: Edema present.  Skin:    General: Skin is warm.     Findings: Erythema and rash present.  Neurological:     Mental Status: He is alert and oriented to person, place, and time.      ED Treatments / Results  Labs (all labs ordered are listed, but only abnormal results are displayed) Labs Reviewed  COMPREHENSIVE METABOLIC PANEL - Abnormal; Notable for the following components:      Result Value   Glucose, Bld 176 (*)    Calcium 8.6 (*)    Total Protein 6.0 (*)    AST 53 (*)    GFR calc non Af Amer 59 (*)    All other components within normal limits  CBC WITH DIFFERENTIAL/PLATELET    EKG None  Radiology Dg  Ankle Complete Left  Result Date: 02/15/2018 CLINICAL DATA:  Possible posttraumatic leg infection. EXAM: LEFT ANKLE COMPLETE - 3+ VIEW COMPARISON:  None. FINDINGS: There is no evidence of fracture, dislocation, or joint effusion. Nonspecific soft tissue swelling. Osteopenic appearance. IMPRESSION: Soft tissue swelling without acute osseous finding or opaque foreign body. Electronically Signed  By: Marnee Spring M.D.   On: 02/15/2018 05:26    Procedures Procedures (including critical care time)  Medications Ordered in ED Medications  Tdap (BOOSTRIX) injection 0.5 mL (has no administration in time range)  acetaminophen (TYLENOL) tablet 650 mg (has no administration in time range)  doxycycline (VIBRA-TABS) tablet 100 mg (100 mg Oral Given 02/15/18 0811)  ketorolac (TORADOL) 30 MG/ML injection 30 mg (30 mg Intramuscular Given 02/15/18 0815)     Initial Impression / Assessment and Plan / ED Course  I have reviewed the triage vital signs and the nursing notes.  Pertinent labs & imaging results that were available during my care of the patient were reviewed by me and considered in my medical decision making (see chart for details).     78 year old comes in with chief complaint of left lower extremity swelling and pain.  Patient injured his leg about 4 to 5 days ago.  Since then he has been managing the wound himself at home, however it appears that he continues to have pain with worsening swelling.  On my exam he has erythema, edema and calor.  His pain appears to be because of skin abrasion.  There is no clear evidence of abscess or deep space infection right now.  Plan is to start patient on doxycycline and Keflex.  Tdap will be updated.  Pain control for now.  Wound care precautions discussed.  Final Clinical Impressions(s) / ED Diagnoses   Final diagnoses:  Cellulitis of left lower extremity  Left leg pain  Contusion of left lower extremity, initial encounter    ED Discharge Orders          Ordered    doxycycline (VIBRAMYCIN) 100 MG capsule  2 times daily     02/15/18 0845    cephALEXin (KEFLEX) 500 MG capsule  4 times daily     02/15/18 0845    acetaminophen (TYLENOL 8 HOUR) 650 MG CR tablet  Every 8 hours PRN     02/15/18 0845    naproxen (NAPROSYN) 375 MG tablet  2 times daily     02/15/18 0845    neomycin-bacitracin-polymyxin (NEOSPORIN) ointment  Every 12 hours     02/15/18 0845           Derwood Kaplan, MD 02/15/18 [phone]

## 2018-02-15 NOTE — ED Notes (Signed)
Patient verbalizes understanding of discharge instructions. Opportunity for questioning and answers were provided. Pt discharged from ED. 

## 2018-02-15 NOTE — ED Triage Notes (Addendum)
Pt was sitting in a tree 20 feet up cutting down a branch.  Pt got his left leg stuck, the fire company had to come get him down.  Pt has "taken care of my leg" but it appears to have gotten infected.  Pt was ambulatory in triage but hurts.  Pt blood pressure is soft, PCP adjust medications.

## 2018-02-28 ENCOUNTER — Emergency Department (HOSPITAL_COMMUNITY)
Admission: EM | Admit: 2018-02-28 | Discharge: 2018-02-28 | Disposition: A | Discharge status: Home / Self Care | Payer: Medicare Other | Attending: Emergency Medicine | Admitting: Emergency Medicine

## 2018-02-28 ENCOUNTER — Encounter (HOSPITAL_COMMUNITY): Payer: Self-pay

## 2018-02-28 DIAGNOSIS — I10 Essential (primary) hypertension: Secondary | ICD-10-CM | POA: Insufficient documentation

## 2018-02-28 DIAGNOSIS — Z4800 Encounter for change or removal of nonsurgical wound dressing: Secondary | ICD-10-CM | POA: Diagnosis present

## 2018-02-28 DIAGNOSIS — Z87891 Personal history of nicotine dependence: Secondary | ICD-10-CM | POA: Insufficient documentation

## 2018-02-28 DIAGNOSIS — Z5189 Encounter for other specified aftercare: Secondary | ICD-10-CM

## 2018-02-28 DIAGNOSIS — Z79899 Other long term (current) drug therapy: Secondary | ICD-10-CM | POA: Diagnosis not present

## 2018-02-28 DIAGNOSIS — Z7982 Long term (current) use of aspirin: Secondary | ICD-10-CM | POA: Insufficient documentation

## 2018-02-28 LAB — CBC WITH DIFFERENTIAL/PLATELET
Abs Immature Granulocytes: 0.03 10*3/uL (ref 0.00–0.07)
BASOS ABS: 0 10*3/uL (ref 0.0–0.1)
Basophils Relative: 1 %
Eosinophils Absolute: 0.2 10*3/uL (ref 0.0–0.5)
Eosinophils Relative: 3 %
HEMATOCRIT: 43.8 % (ref 39.0–52.0)
Hemoglobin: 14.3 g/dL (ref 13.0–17.0)
IMMATURE GRANULOCYTES: 1 %
LYMPHS ABS: 1 10*3/uL (ref 0.7–4.0)
Lymphocytes Relative: 17 %
MCH: 31 pg (ref 26.0–34.0)
MCHC: 32.6 g/dL (ref 30.0–36.0)
MCV: 95 fL (ref 80.0–100.0)
Monocytes Absolute: 0.5 10*3/uL (ref 0.1–1.0)
Monocytes Relative: 8 %
NRBC: 0 % (ref 0.0–0.2)
Neutro Abs: 4.3 10*3/uL (ref 1.7–7.7)
Neutrophils Relative %: 70 %
Platelets: 211 10*3/uL (ref 150–400)
RBC: 4.61 MIL/uL (ref 4.22–5.81)
RDW: 13.4 % (ref 11.5–15.5)
WBC: 6 10*3/uL (ref 4.0–10.5)

## 2018-02-28 MED ORDER — TRAMADOL HCL 50 MG PO TABS: 50.0000 mg | ORAL_TABLET | Freq: Four times a day (QID) | ORAL | 0 refills | Status: DC | PRN

## 2018-02-28 NOTE — Discharge Instructions (Addendum)
Please return for any problem.  Follow-up with your regular care provider as instructed.  Clean wound on a daily basis gently with soap and water.  Apply Neosporin and a clean dressing after washing the wound.

## 2018-02-28 NOTE — ED Triage Notes (Signed)
Pt presents for evaluation of L lower leg pain x 1 week. Reports injured leg in tree accident on 17th but leg is red and painful.

## 2018-02-28 NOTE — ED Provider Notes (Signed)
[facility] Provider [facility]   CSN: [id number] Arrival date & time: 02/28/18  0700     History   Chief Complaint Chief Complaint  Patient presents with  . Leg Pain    HPI [name] is a 78 y.o. male.  78 year old male with prior medical history as detailed below presents for evaluation of wound to the left lower extremity.  Patient was seen here on January 17 for initial evaluation of a wound to the left lower extremity.  He reports that he was limbing trees when his left leg was abraded by the tree itself.  He was seen here on the 17th.  He was discharged with prescriptions for Keflex and doxycycline.  Patient reports that he has almost completed courses of those antibiotics.  He presents today with complaint of persistent pain to the left shin.  He complains of persistent abrasions.  He reports that the wounds are healing slowly.  He denies fever.  He denies other complaint.  The history is provided by the patient and medical records.  Illness  Location:  Abrasions to left shin Severity:  Mild Onset quality:  Gradual Duration:  13 days Timing:  Constant Progression:  Partially resolved Chronicity:  New Associated symptoms: no fever     Past Medical History:  Diagnosis Date  . Arthritis   . CAD (coronary artery disease)    CABG 2007 with LIMA-LAD, SVG-D, sequential SVG-OM and CFX PDA. Myoview 12/09 was probably a negative study with attenuation artifact  . History of kidney stones late 7170's early 7680's  . HLD (hyperlipidemia)   . HTN (hypertension)   . Kidney stones   . Myocardial infarction (HCC) 2008   light  . Positional vertigo     Patient Active Problem List   Diagnosis Date Noted  . Pain of left hip joint 06/15/2016  . POSITIONAL VERTIGO 05/14/2009  . Hyperlipidemia 03/03/2009  . HYPERTENSION, BENIGN 03/03/2009  . CORONARY ATHEROSCLEROSIS OF ARTERY BYPASS GRAFT 03/03/2009    Past Surgical History:  Procedure  Laterality Date  . bypass surgery (otheR)  August 2007  . COLONOSCOPY    . CORONARY ARTERY BYPASS GRAFT    . SHOULDER SURGERY    . TONSILLECTOMY          Home Medications    Prior to Admission medications   Medication Sig Start Date End Date Taking? Authorizing Provider  acetaminophen (TYLENOL 8 HOUR) 650 MG CR tablet Take 1 tablet (650 mg total) by mouth every 8 (eight) hours as needed for pain or fever. 02/15/18   Derwood KaplanNanavati, Ankit, MD  amLODipine (NORVASC) 5 MG tablet Take 1 tablet (5 mg total) by mouth daily. 10/05/17   Antonieta IbaGollan, Timothy J, MD  aspirin EC 81 MG tablet Take 1 tablet (81 mg total) by mouth daily. 10/05/17   Antonieta IbaGollan, Timothy J, MD  Bilberry 1000 MG CAPS Take 1 capsule by mouth daily.    [provider]  cephALEXin (KEFLEX) 500 MG capsule Take 1 capsule (500 mg total) by mouth 4 (four) times daily. 02/15/18   Derwood KaplanNanavati, Ankit, MD  doxycycline (VIBRAMYCIN) 100 MG capsule Take 1 capsule (100 mg total) by mouth 2 (two) times daily. 02/15/18   Derwood KaplanNanavati, Ankit, MD  ezetimibe (ZETIA) 10 MG tablet Take 1 tablet (10 mg total) by mouth daily. 10/08/17 01/06/18  Antonieta IbaGollan, Timothy J, MD  ibuprofen (ADVIL,MOTRIN) 200 MG tablet Take 200 mg by mouth every 6 (six) hours as needed for headache or moderate pain.  [provider]  lisinopril (PRINIVIL,ZESTRIL) 20 MG tablet TAKE 1 TABLET (20 MG TOTAL) BY MOUTH DAILY. Patient taking differently: Take 20 mg by mouth daily.  10/05/17   Antonieta Iba, MD  metoprolol tartrate (LOPRESSOR) 25 MG tablet TAKE 1 TABLET (25 MG TOTAL) BY MOUTH 2 (TWO) TIMES DAILY. Patient taking differently: Take 25 mg by mouth 2 (two) times daily.  10/05/17   Antonieta Iba, MD  Multiple Vitamins-Minerals (ONE-A-DAY EXTRAS ANTIOXIDANT) CAPS Take by mouth daily.      [provider]  naproxen (NAPROSYN) 375 MG tablet Take 1 tablet (375 mg total) by mouth 2 (two) times daily. 02/15/18   Derwood Kaplan, MD  neomycin-bacitracin-polymyxin (NEOSPORIN)  ointment Apply 1 application topically every 12 (twelve) hours. 02/15/18   Derwood Kaplan, MD  ondansetron (ZOFRAN ODT) 4 MG disintegrating tablet Take 1 tablet (4 mg total) by mouth every 8 (eight) hours as needed for nausea or vomiting. 10/20/17   Shaune Pollack, MD  simvastatin (ZOCOR) 40 MG tablet Take 1 tablet (40 mg total) by mouth at bedtime. 10/05/17   Antonieta Iba, MD  vitamin E (VITAMIN E) 400 UNIT capsule Take 400 Units by mouth daily.    [provider]    Family History Family History  Problem Relation Age of Onset  . Alzheimer's disease Father   . Colon cancer Father   . Prostate cancer Father   . Diabetes Sister   . Prostate cancer Brother   . Diabetes Mother     Social History Social History   Tobacco Use  . Smoking status: Former Games developer  . Smokeless tobacco: Never Used  Substance Use Topics  . Alcohol use: No  . Drug use: No     Allergies   Codeine   Review of Systems Review of Systems  Constitutional: Negative for fever.  All other systems reviewed and are negative.    Physical Exam Updated Vital Signs There were no vitals taken for this visit.  Physical Exam Vitals signs and nursing note reviewed.  Constitutional:      General: He is not in acute distress.    Appearance: He is well-developed.  HENT:     Head: Normocephalic and atraumatic.  Eyes:     Conjunctiva/sclera: Conjunctivae normal.     Pupils: Pupils are equal, round, and reactive to light.  Neck:     Musculoskeletal: Normal range of motion and neck supple.  Cardiovascular:     Rate and Rhythm: Normal rate and regular rhythm.     Heart sounds: Normal heart sounds.  Pulmonary:     Effort: Pulmonary effort is normal. No respiratory distress.     Breath sounds: Normal breath sounds.  Abdominal:     General: There is no distension.     Palpations: Abdomen is soft.     Tenderness: There is no abdominal tenderness.  Musculoskeletal: Normal range of motion.         General: No deformity.  Skin:    General: Skin is warm and dry.     Comments: Multiple abrasions to the anterior and lateral aspects of the left shin.  See image below.  Neurological:     Mental Status: He is alert and oriented to person, place, and time.            ED Treatments / Results  Labs (all labs ordered are listed, but only abnormal results are displayed) Labs Reviewed  CBC WITH DIFFERENTIAL/PLATELET    EKG None  Radiology No  results found.  Procedures Procedures (including critical care time)  Medications Ordered in ED Medications - No data to display   Initial Impression / Assessment and Plan / ED Course  I have reviewed the triage vital signs and the nursing notes.  Pertinent labs & imaging results that were available during my care of the patient were reviewed by me and considered in my medical decision making (see chart for details).     MDM  Screen complete  Patient is presenting for evaluation of wound to the left lower extremity.  Injury occurred approximately 14 days prior.  Patient is concerned that the wound is slowly healing.  Patient has applied different lotions and creams to the wound itself.  The purple and oranges discoloration notable in the photo above is secondary to these applied creams.  Patient without evidence on exam of significant infection.  Patient is afebrile.  White count is normal.  Patient's wound appears to be healing appropriately.  Patient educated as to the appropriate outpatient management of his wound.  Patient understand need for close follow-up.  Strict return precautions given and understood.   Final Clinical Impressions(s) / ED Diagnoses   Final diagnoses:  Visit for wound check    ED Discharge Orders         Ordered    traMADol (ULTRAM) 50 MG tablet  Every 6 hours PRN     02/28/18 0826           Wynetta Fines, MD 02/28/18 [phone]

## 2018-09-27 ENCOUNTER — Telehealth: Payer: Self-pay | Admitting: Cardiovascular Disease

## 2018-09-27 NOTE — Telephone Encounter (Signed)
Returned call to patient, no answer, no VM available.

## 2018-09-27 NOTE — Telephone Encounter (Signed)
Patient wants a letter from Dr. Rockey Situ stating he is of sound mind and not crazy.  Patient states his daughter is trying to have him and his wife committed and he wants to be prepared with a letter from cards and pcp stating he is of sound mind.    Please call when ready for pick up.

## 2018-09-29 NOTE — Telephone Encounter (Signed)
Would probably try PMD If they decline, we can help. I think they are probably going to want to hold off on letter until it might be actually needed. This is not a very common request.

## 2018-09-30 NOTE — Telephone Encounter (Signed)
Attempted to call patient. LMTCB 09/30/2018

## 2018-10-01 NOTE — Telephone Encounter (Signed)
No answer on home number. Left message to call back if still in need of assistance or information.  No answer on cell number after a few rings and then said call cannot be completed.

## 2018-10-03 NOTE — Telephone Encounter (Signed)
Call unavailable at this time. Will review chart for other numbers.

## 2018-10-03 NOTE — Telephone Encounter (Signed)
Attempted to call pt and message states "Call can not be completed at this time." Will try to call other numbers listed in chart.

## 2018-10-03 NOTE — Telephone Encounter (Signed)
Left voicemail message on home number but unable to reach any others.

## 2018-10-10 NOTE — Telephone Encounter (Signed)
Called over to patients PCP and spoke with Amanda Pea. Advised that patient called [place] wanting letter and we recommended he call them. Let her know that we have been unable to reach him to review that. She took his information and will try to reach out to him on the numbers they have listed in his chart. Will close encounter since message has been left and they are aware.

## 2018-10-10 NOTE — Telephone Encounter (Signed)
Left detailed voicemail message that he should check with his PCP regarding letter and to please call us back if we can assist any further.

## 2018-10-13 NOTE — Progress Notes (Signed)
Cardiology Office Note  Date:  10/15/2018   ID:  [name], DOB [birth year], MRN [id number]  PCP:  Kaleen MaskElkins, Wilson Oliver, MD   Chief Complaint  Patient presents with  . other    12 month f/u no complaints today. Meds reviewed verbally with pt.    HPI:  Mr. Roby LoftsBilly Sida is a 78 yo gentleman with a h/o   coronary artery disease, HTN,  CABG in August of 2007 with a LIMA  to the LAD,  vein graft to the diagonal, and graft to the OM and PDA of the circumflex,  hypertension  hyperlipidemia.   Who presents for routine follow up of his coronary artery disease.  Active, Falling out with daughter He reports that he has a young woman that helps him in the house, also helps his wife get around Home No chest pain, no SOB  Denies any chest pain concerning for angina She continues to do some heavy labor, not as much as before  No recent falls Reports having a fall last year while putting motor in the truck, hurt himself  Feels his weight is stable  does not restrict his diet  EKG personally reviewed by myself on todays visit Shows normal sinus rhythm with rate 73 bpm no significant ST or T wave changes  Other past medical history Previously had vertigo, improved with meclizine   PMH:   has a past medical history of Arthritis, CAD (coronary artery disease), History of kidney stones (late 70's early 80's), HLD (hyperlipidemia), HTN (hypertension), Kidney stones, Myocardial infarction (HCC) (2008), and Positional vertigo.  PSH:    Past Surgical History:  Procedure Laterality Date  . bypass surgery (otheR)  August 2007  . COLONOSCOPY    . CORONARY ARTERY BYPASS GRAFT    . SHOULDER SURGERY    . TONSILLECTOMY      Current Outpatient Medications  Medication Sig Dispense Refill  . acetaminophen (TYLENOL 8 HOUR) 650 MG CR tablet Take 1 tablet (650 mg total) by mouth every 8 (eight) hours as needed for pain or fever. 30 tablet 0  . amLODipine (NORVASC) 5 MG tablet Take 1 tablet  (5 mg total) by mouth daily. 90 tablet 3  . aspirin EC 81 MG tablet Take 1 tablet (81 mg total) by mouth daily.    . Bilberry 1000 MG CAPS Take 1 capsule by mouth daily.    Marland Kitchen. ibuprofen (ADVIL,MOTRIN) 200 MG tablet Take 200 mg by mouth every 6 (six) hours as needed for headache or moderate pain.     Marland Kitchen. lisinopril (PRINIVIL,ZESTRIL) 20 MG tablet TAKE 1 TABLET (20 MG TOTAL) BY MOUTH DAILY. (Patient taking differently: Take 20 mg by mouth daily. ) 90 tablet 3  . metoprolol tartrate (LOPRESSOR) 25 MG tablet TAKE 1 TABLET (25 MG TOTAL) BY MOUTH 2 (TWO) TIMES DAILY. (Patient taking differently: Take 25 mg by mouth 2 (two) times daily. ) 180 tablet 3  . Multiple Vitamins-Minerals (ONE-A-DAY EXTRAS ANTIOXIDANT) CAPS Take by mouth daily.      . naproxen (NAPROSYN) 375 MG tablet Take 1 tablet (375 mg total) by mouth 2 (two) times daily. 10 tablet 0  . neomycin-bacitracin-polymyxin (NEOSPORIN) ointment Apply 1 application topically every 12 (twelve) hours. 15 g 0  . simvastatin (ZOCOR) 40 MG tablet Take 1 tablet (40 mg total) by mouth at bedtime. 90 tablet 3  . traMADol (ULTRAM) 50 MG tablet Take 1 tablet (50 mg total) by mouth every 6 (six) hours as needed. 12 tablet 0  .  vitamin E (VITAMIN E) 400 UNIT capsule Take 400 Units by mouth daily.     No current facility-administered medications for this visit.     Allergies:   Codeine   Social History:  The patient  reports that he has quit smoking. He has never used smokeless tobacco. He reports that he does not drink alcohol or use drugs.   Family History:   family history includes Alzheimer's disease in his father; Colon cancer in his father; Diabetes in his mother and sister; Prostate cancer in his brother and father.    Review of Systems: Review of Systems  Constitutional: Negative.   Respiratory: Negative.   Cardiovascular: Negative.   Gastrointestinal: Negative.   Musculoskeletal: Positive for back pain and joint pain.  Neurological: Negative.    Psychiatric/Behavioral: Negative.   All other systems reviewed and are negative.    PHYSICAL EXAM: VS:  BP 132/70 (BP Location: Left Arm, Patient Position: Sitting, Cuff Size: Normal)   Pulse 73   Ht 6\' 1"  (1.854 m)   Wt 208 lb 8 oz (94.6 kg)   SpO2 98%   BMI 27.51 kg/m  , BMI Body mass index is 27.51 kg/m. Constitutional:  oriented to person, place, and time. No distress.  HENT:  Head: Grossly normal Eyes:  no discharge. No scleral icterus.  Neck: No JVD, no carotid bruits  Cardiovascular: Regular rate and rhythm, no murmurs appreciated Pulmonary/Chest: Clear to auscultation bilaterally, no wheezes or rails Abdominal: Soft.  no distension.  no tenderness.  Musculoskeletal: Normal range of motion Neurological:  normal muscle tone. Coordination normal. No atrophy Skin: Skin warm and dry Psychiatric: normal affect, pleasant   Recent Labs: 02/15/2018: ALT 37; BUN 19; Creatinine, Ser 1.17; Potassium 4.2; Sodium 140 02/28/2018: Hemoglobin 14.3; Platelets 211    Lipid Panel Lab Results  Component Value Date   CHOL 135 10/05/2017   HDL 36 (L) 10/05/2017   LDLCALC 80 10/05/2017   TRIG 95 10/05/2017      Wt Readings from Last 3 Encounters:  10/15/18 208 lb 8 oz (94.6 kg)  02/15/18 200 lb (90.7 kg)  10/05/17 202 lb 8 oz (91.9 kg)       ASSESSMENT AND PLAN:  Atherosclerosis of coronary artery bypass graft of native heart without angina pectoris - Plan: EKG 12-Lead Denies any recent anginal symptoms Very active at baseline, no ischemic work-up at this time EKG unchanged Lipids close to goal  HYPERTENSION, BENIGN - Plan: EKG 12-Lead Blood pressure stable, no medication changes made  Hyperlipidemia Cholesterol is at goal on the current lipid regimen. No changes to the medications were made.     Total encounter time more than 25 minutes  Greater than 50% was spent in counseling and coordination of care with the patient  Disposition:   F/U  12 months   No  orders of the defined types were placed in this encounter.    Signed, Esmond Plants, M.D., Ph.D. 10/15/2018  [place], [place]

## 2018-10-15 ENCOUNTER — Ambulatory Visit (INDEPENDENT_AMBULATORY_CARE_PROVIDER_SITE_OTHER): Payer: Medicare Other | Admitting: Cardiovascular Disease

## 2018-10-15 ENCOUNTER — Encounter: Payer: Self-pay | Admitting: Cardiovascular Disease

## 2018-10-15 ENCOUNTER — Encounter

## 2018-10-15 ENCOUNTER — Other Ambulatory Visit: Payer: Self-pay

## 2018-10-15 VITALS — BP 132/70 | HR 73 | Ht 73.0 in | Wt 208.5 lb

## 2018-10-15 DIAGNOSIS — I10 Essential (primary) hypertension: Secondary | ICD-10-CM

## 2018-10-15 DIAGNOSIS — I25708 Atherosclerosis of coronary artery bypass graft(s), unspecified, with other forms of angina pectoris: Secondary | ICD-10-CM | POA: Diagnosis not present

## 2018-10-15 DIAGNOSIS — E782 Mixed hyperlipidemia: Secondary | ICD-10-CM

## 2018-10-15 DIAGNOSIS — Z23 Encounter for immunization: Secondary | ICD-10-CM | POA: Diagnosis not present

## 2018-10-15 NOTE — Patient Instructions (Signed)

## 2018-11-25 ENCOUNTER — Other Ambulatory Visit: Payer: Self-pay | Admitting: Cardiovascular Disease

## 2018-11-26 ENCOUNTER — Other Ambulatory Visit: Payer: Self-pay | Admitting: Cardiovascular Disease

## 2018-12-06 ENCOUNTER — Other Ambulatory Visit: Payer: Self-pay | Admitting: Cardiovascular Disease

## 2019-03-02 ENCOUNTER — Other Ambulatory Visit: Payer: Self-pay | Admitting: Cardiovascular Disease

## 2019-10-07 ENCOUNTER — Other Ambulatory Visit: Payer: Self-pay | Admitting: Cardiovascular Disease

## 2019-12-04 ENCOUNTER — Other Ambulatory Visit: Payer: Self-pay | Admitting: Cardiovascular Disease

## 2019-12-04 NOTE — Telephone Encounter (Signed)
Please schedule overdue 12 month F/U appointment. Thank you! 

## 2019-12-04 NOTE — Telephone Encounter (Signed)
LVM to schedule yearly appt.

## 2019-12-08 NOTE — Telephone Encounter (Signed)
LVM to schedule overdue fu °

## 2019-12-11 ENCOUNTER — Other Ambulatory Visit: Payer: Self-pay | Admitting: Cardiovascular Disease

## 2019-12-15 NOTE — Telephone Encounter (Signed)
Attempted to schedule no ans no vm  3 attempts to schedule fu  closing encounter

## 2020-01-02 ENCOUNTER — Other Ambulatory Visit: Payer: Self-pay | Admitting: Cardiovascular Disease

## 2020-01-02 NOTE — Telephone Encounter (Signed)
Scheduled for 1/18 with [facility]

## 2020-01-02 NOTE — Telephone Encounter (Signed)
Pt overdue for 12 month f/u.  Please contact pt to schedule future appointment.

## 2020-01-10 ENCOUNTER — Other Ambulatory Visit: Payer: Self-pay | Admitting: Cardiovascular Disease

## 2020-01-15 ENCOUNTER — Other Ambulatory Visit: Payer: Self-pay | Admitting: Cardiovascular Disease

## 2020-02-06 ENCOUNTER — Other Ambulatory Visit: Payer: Self-pay | Admitting: Cardiovascular Disease

## 2020-02-12 ENCOUNTER — Other Ambulatory Visit: Payer: Self-pay | Admitting: *Deleted

## 2020-02-12 MED ORDER — AMLODIPINE BESYLATE 5 MG PO TABS: 5.0000 mg | ORAL_TABLET | Freq: Every day | ORAL | 0 refills | Status: DC

## 2020-02-17 ENCOUNTER — Ambulatory Visit: Payer: Medicare Other | Admitting: Cardiovascular Disease

## 2020-03-03 ENCOUNTER — Other Ambulatory Visit: Payer: Self-pay | Admitting: Cardiovascular Disease

## 2020-03-03 NOTE — Telephone Encounter (Signed)
Rx request sent to pharmacy.  

## 2020-03-08 ENCOUNTER — Other Ambulatory Visit: Payer: Self-pay

## 2020-03-08 MED ORDER — AMLODIPINE BESYLATE 5 MG PO TABS: 5.0000 mg | ORAL_TABLET | Freq: Every day | ORAL | 0 refills | Status: DC

## 2020-03-08 MED ORDER — SIMVASTATIN 40 MG PO TABS: 40.0000 mg | ORAL_TABLET | Freq: Every day | ORAL | 0 refills | Status: DC

## 2020-03-11 NOTE — Progress Notes (Unsigned)
Cardiology Office Note  Date:  03/12/2020   ID:  Cort, Dragoo 06-Dec-1940, MRN [id number]  PCP:  Kaleen Mask, MD   Chief Complaint  Patient presents with  . Annual Exam    Pt states he is doing well--he is fasting today if blood work is needed; wife has passed away    HPI:  [name] is a 80 yo gentleman with a h/o   coronary artery disease, HTN,  CABG in August of 2007 with a LIMA  to the LAD,  vein graft to the diagonal, and graft to the OM and PDA of the circumflex,  hypertension  hyperlipidemia.   Who presents for routine follow up of his coronary artery disease.  Has a girlfriend, 36 yo Active, chainsaw,  Falling out with daughter, still lives in the house No chest pain, no SOB, no angina  No edema he continues to do some heavy labor Weight stable  EKG personally reviewed by myself on todays visit Shows normal sinus rhythm with rate 55 bpm no significant ST or T wave changes  Other past medical history Previously had vertigo, improved with meclizine   PMH:   has a past medical history of Arthritis, CAD (coronary artery disease), History of kidney stones (late 74's early 80's), HLD (hyperlipidemia), HTN (hypertension), Kidney stones, Myocardial infarction (HCC) (2008), and Positional vertigo.  PSH:    Past Surgical History:  Procedure Laterality Date  . bypass surgery (otheR)  August 2007  . COLONOSCOPY    . CORONARY ARTERY BYPASS GRAFT    . SHOULDER SURGERY    . TONSILLECTOMY      Current Outpatient Medications  Medication Sig Dispense Refill  . acetaminophen (TYLENOL 8 HOUR) 650 MG CR tablet Take 1 tablet (650 mg total) by mouth every 8 (eight) hours as needed for pain or fever. 30 tablet 0  . amLODipine (NORVASC) 5 MG tablet Take 1 tablet (5 mg total) by mouth daily. 90 tablet 0  . aspirin EC 81 MG tablet Take 1 tablet (81 mg total) by mouth daily.    . Bilberry 1000 MG CAPS Take 1 capsule by mouth daily.    Marland Kitchen ibuprofen  (ADVIL,MOTRIN) 200 MG tablet Take 200 mg by mouth every 6 (six) hours as needed for headache or moderate pain.     Marland Kitchen lisinopril (ZESTRIL) 20 MG tablet TAKE 1 TABLET BY MOUTH EVERY DAY 30 tablet 0  . metoprolol tartrate (LOPRESSOR) 25 MG tablet TAKE 1 TABLET BY MOUTH TWICE A DAY 30 tablet 0  . Multiple Vitamins-Minerals (ONE-A-DAY EXTRAS ANTIOXIDANT) CAPS Take by mouth daily.      . naproxen (NAPROSYN) 375 MG tablet Take 1 tablet (375 mg total) by mouth 2 (two) times daily. 10 tablet 0  . neomycin-bacitracin-polymyxin (NEOSPORIN) ointment Apply 1 application topically every 12 (twelve) hours. 15 g 0  . simvastatin (ZOCOR) 40 MG tablet Take 1 tablet (40 mg total) by mouth daily at 6 PM. Please keep your upcoming appointment with Dr. Mariah Milling for refills. 90 tablet 0  . tamsulosin (FLOMAX) 0.4 MG CAPS capsule Take 0.4 mg by mouth daily.    . traMADol (ULTRAM) 50 MG tablet Take 1 tablet (50 mg total) by mouth every 6 (six) hours as needed. 12 tablet 0  . vitamin E 180 MG (400 UNITS) capsule Take 400 Units by mouth daily.     No current facility-administered medications for this visit.    Allergies:   Codeine   Social History:  The  patient  reports that he has quit smoking. He has never used smokeless tobacco. He reports that he does not drink alcohol and does not use drugs.   Family History:   family history includes Alzheimer's disease in his father; Colon cancer in his father; Diabetes in his mother and sister; Prostate cancer in his brother and father.    Review of Systems: Review of Systems  Constitutional: Negative.   Respiratory: Negative.   Cardiovascular: Negative.   Gastrointestinal: Negative.   Musculoskeletal: Positive for back pain and joint pain.  Neurological: Negative.   Psychiatric/Behavioral: Negative.   All other systems reviewed and are negative.    PHYSICAL EXAM: VS:  BP 138/74   Pulse (!) 55   Ht 6\' 1"  (1.854 m)   Wt 202 lb (91.6 kg)   BMI 26.65 kg/m  , BMI Body  mass index is 26.65 kg/m. Constitutional:  oriented to person, place, and time. No distress.  HENT:  Head: Grossly normal Eyes:  no discharge. No scleral icterus.  Neck: No JVD, no carotid bruits  Cardiovascular: Regular rate and rhythm, no murmurs appreciated Pulmonary/Chest: Clear to auscultation bilaterally, no wheezes or rails Abdominal: Soft.  no distension.  no tenderness.  Musculoskeletal: Normal range of motion Neurological:  normal muscle tone. Coordination normal. No atrophy Skin: Skin warm and dry Psychiatric: normal affect, pleasant   Recent Labs: No results found for requested labs within last 8760 hours.    Lipid Panel Lab Results  Component Value Date   CHOL 135 10/05/2017   HDL 36 (L) 10/05/2017   LDLCALC 80 10/05/2017   TRIG 95 10/05/2017      Wt Readings from Last 3 Encounters:  03/12/20 202 lb (91.6 kg)  10/15/18 208 lb 8 oz (94.6 kg)  02/15/18 200 lb (90.7 kg)      ASSESSMENT AND PLAN:  Atherosclerosis of coronary artery bypass graft of native heart without angina pectoris - Plan: EKG 12-Lead Cholesterol is at goal on the current lipid regimen. No changes to the medications were made.  HYPERTENSION, BENIGN - Plan: EKG 12-Lead Blood pressure stable, no medication changes made  Hyperlipidemia Cholesterol is at goal in 2019 on the current lipid regimen. No changes to the medications were made. Repeat lipids today  Back pain Takes aleve PRN Recommend he try to moderate his NSAIDs   Total encounter time more than 25 minutes  Greater than 50% was spent in counseling and coordination of care with the patient  No orders of the defined types were placed in this encounter.    Signed, 2020, M.D., Ph.D. 03/12/2020  [facility] [place], [place]

## 2020-03-12 ENCOUNTER — Ambulatory Visit (INDEPENDENT_AMBULATORY_CARE_PROVIDER_SITE_OTHER): Payer: Medicare HMO | Admitting: Cardiovascular Disease

## 2020-03-12 ENCOUNTER — Other Ambulatory Visit: Payer: Self-pay

## 2020-03-12 ENCOUNTER — Encounter: Payer: Self-pay | Admitting: Cardiovascular Disease

## 2020-03-12 VITALS — BP 138/74 | HR 55 | Ht 73.0 in | Wt 202.0 lb

## 2020-03-12 DIAGNOSIS — I25708 Atherosclerosis of coronary artery bypass graft(s), unspecified, with other forms of angina pectoris: Secondary | ICD-10-CM

## 2020-03-12 DIAGNOSIS — E782 Mixed hyperlipidemia: Secondary | ICD-10-CM | POA: Diagnosis not present

## 2020-03-12 DIAGNOSIS — I10 Essential (primary) hypertension: Secondary | ICD-10-CM

## 2020-03-12 MED ORDER — AMLODIPINE BESYLATE 5 MG PO TABS: 5.0000 mg | ORAL_TABLET | Freq: Every day | ORAL | 3 refills | Status: DC

## 2020-03-12 MED ORDER — METOPROLOL TARTRATE 25 MG PO TABS: 25.0000 mg | ORAL_TABLET | Freq: Two times a day (BID) | ORAL | 3 refills | Status: DC

## 2020-03-12 MED ORDER — SIMVASTATIN 40 MG PO TABS: 40.0000 mg | ORAL_TABLET | Freq: Every day | ORAL | 3 refills | Status: DC

## 2020-03-12 MED ORDER — LISINOPRIL 20 MG PO TABS: 20.0000 mg | ORAL_TABLET | Freq: Every day | ORAL | 3 refills | Status: DC

## 2020-03-12 NOTE — Patient Instructions (Signed)
Labs as below  Medication Instructions:  No changes  If you need a refill on your cardiac medications before your next appointment, please call your pharmacy.    Lab work: CMP, lipids   If you have labs (blood work) drawn today and your tests are completely normal, you will receive your results only by: Marland Kitchen MyChart Message (if you have MyChart) OR . A paper copy in the mail If you have any lab test that is abnormal or we need to change your treatment, we will call you to review the results.   Testing/Procedures: No new testing needed   Follow-Up: At [facility], you and your health needs are our priority.  As part of our continuing mission to provide you with exceptional heart care, we have created designated Provider Care Teams.  These Care Teams include your primary Cardiologist (physician) and Advanced Practice Providers (APPs -  Physician Assistants and Nurse Practitioners) who all work together to provide you with the care you need, when you need it.  . You will need a follow up appointment in 12 months  . Providers on your designated Care Team:   . Nicolasa Ducking, NP . Eula Listen, PA-C . Marisue Ivan, PA-C  Any Other Special Instructions Will Be Listed Below (If Applicable).  COVID-19 Vaccine Information can be found at: [email or web address] For questions related to vaccine distribution or appointments, please email [email or web address] or call [phone].

## 2020-03-13 LAB — COMPREHENSIVE METABOLIC PANEL
ALT: 19 IU/L (ref 0–44)
AST: 20 IU/L (ref 0–40)
Albumin/Globulin Ratio: 2.4 — ABNORMAL HIGH (ref 1.2–2.2)
Albumin: 4.3 g/dL (ref 3.6–4.6)
Alkaline Phosphatase: 68 IU/L (ref 44–121)
BUN/Creatinine Ratio: 21 (ref 10–24)
BUN: 21 mg/dL (ref 8–27)
Bilirubin Total: 0.6 mg/dL (ref 0.0–1.2)
CO2: 19 mmol/L — ABNORMAL LOW (ref 20–29)
Calcium: 8.3 mg/dL — ABNORMAL LOW (ref 8.6–10.2)
Chloride: 111 mmol/L — ABNORMAL HIGH (ref 96–106)
Creatinine, Ser: 0.99 mg/dL (ref 0.76–1.27)
GFR calc Af Amer: 82 mL/min/{1.73_m2} (ref >59)
GFR calc non Af Amer: 71 mL/min/{1.73_m2} (ref >59)
Globulin, Total: 1.8 g/dL (ref 1.5–4.5)
Glucose: 105 mg/dL — ABNORMAL HIGH (ref 65–99)
Potassium: 4.9 mmol/L (ref 3.5–5.2)
Sodium: 147 mmol/L — ABNORMAL HIGH (ref 134–144)
Total Protein: 6.1 g/dL (ref 6.0–8.5)

## 2020-03-13 LAB — LIPID PANEL
Chol/HDL Ratio: 2.7 ratio (ref 0.0–5.0)
Cholesterol, Total: 112 mg/dL (ref 100–199)
HDL: 42 mg/dL (ref >39)
LDL Chol Calc (NIH): 57 mg/dL (ref 0–99)
Triglycerides: 60 mg/dL (ref 0–149)
VLDL Cholesterol Cal: 13 mg/dL (ref 5–40)

## 2020-03-14 ENCOUNTER — Other Ambulatory Visit: Payer: Self-pay | Admitting: Cardiovascular Disease

## 2020-03-15 ENCOUNTER — Encounter: Payer: Self-pay | Admitting: *Deleted

## 2020-03-22 ENCOUNTER — Other Ambulatory Visit: Payer: Self-pay | Admitting: Cardiovascular Disease

## 2020-03-22 NOTE — Telephone Encounter (Signed)
Medication recently sent in 03/12/2020   metoprolol tartrate (LOPRESSOR) 25 MG tablet 180 tablet 3 03/12/2020    Sig - Route: Take 1 tablet (25 mg total) by mouth 2 (two) times daily. - Oral   Sent to pharmacy as: metoprolol tartrate (LOPRESSOR) 25 MG tablet   E-Prescribing Status: Receipt confirmed by pharmacy (03/12/2020  9:21 AM EST)     Pharmacy  [facility] - [place], [place] - [address]

## 2020-03-26 ENCOUNTER — Other Ambulatory Visit: Payer: Self-pay | Admitting: Cardiovascular Disease

## 2020-05-14 ENCOUNTER — Other Ambulatory Visit: Payer: Self-pay | Admitting: Cardiovascular Disease

## 2020-05-14 NOTE — Telephone Encounter (Signed)
Rx request sent to pharmacy.  

## 2020-05-20 ENCOUNTER — Other Ambulatory Visit: Payer: Self-pay | Admitting: Cardiovascular Disease

## 2020-06-16 ENCOUNTER — Other Ambulatory Visit: Payer: Self-pay

## 2020-06-16 ENCOUNTER — Emergency Department: Payer: Medicare HMO

## 2020-06-16 ENCOUNTER — Emergency Department
Admission: EM | Admit: 2020-06-16 | Discharge: 2020-06-16 | Disposition: A | Discharge status: Home / Self Care | Payer: Medicare HMO | Attending: Emergency Medicine | Admitting: Emergency Medicine

## 2020-06-16 DIAGNOSIS — I10 Essential (primary) hypertension: Secondary | ICD-10-CM | POA: Diagnosis not present

## 2020-06-16 DIAGNOSIS — R0789 Other chest pain: Secondary | ICD-10-CM | POA: Insufficient documentation

## 2020-06-16 DIAGNOSIS — I2510 Atherosclerotic heart disease of native coronary artery without angina pectoris: Secondary | ICD-10-CM | POA: Diagnosis not present

## 2020-06-16 DIAGNOSIS — Z79899 Other long term (current) drug therapy: Secondary | ICD-10-CM | POA: Diagnosis not present

## 2020-06-16 DIAGNOSIS — W010XXA Fall on same level from slipping, tripping and stumbling without subsequent striking against object, initial encounter: Secondary | ICD-10-CM | POA: Diagnosis not present

## 2020-06-16 DIAGNOSIS — R0781 Pleurodynia: Secondary | ICD-10-CM | POA: Diagnosis not present

## 2020-06-16 DIAGNOSIS — Z7982 Long term (current) use of aspirin: Secondary | ICD-10-CM | POA: Insufficient documentation

## 2020-06-16 DIAGNOSIS — Z951 Presence of aortocoronary bypass graft: Secondary | ICD-10-CM | POA: Diagnosis not present

## 2020-06-16 DIAGNOSIS — Z87891 Personal history of nicotine dependence: Secondary | ICD-10-CM | POA: Diagnosis not present

## 2020-06-16 LAB — TROPONIN I (HIGH SENSITIVITY)
Troponin I (High Sensitivity): 7 ng/L (ref <18)
Troponin I (High Sensitivity): 7 ng/L (ref <18)

## 2020-06-16 LAB — CBC
HCT: 48.3 % (ref 39.0–52.0)
Hemoglobin: 15.9 g/dL (ref 13.0–17.0)
MCH: 31.4 pg (ref 26.0–34.0)
MCHC: 32.9 g/dL (ref 30.0–36.0)
MCV: 95.5 fL (ref 80.0–100.0)
Platelets: 252 10*3/uL (ref 150–400)
RBC: 5.06 MIL/uL (ref 4.22–5.81)
RDW: 13.8 % (ref 11.5–15.5)
WBC: 9.8 10*3/uL (ref 4.0–10.5)
nRBC: 0 % (ref 0.0–0.2)

## 2020-06-16 LAB — BASIC METABOLIC PANEL
Anion gap: 7 (ref 5–15)
BUN: 24 mg/dL — ABNORMAL HIGH (ref 8–23)
CO2: 22 mmol/L (ref 22–32)
Calcium: 8.3 mg/dL — ABNORMAL LOW (ref 8.9–10.3)
Chloride: 110 mmol/L (ref 98–111)
Creatinine, Ser: 0.86 mg/dL (ref 0.61–1.24)
GFR, Estimated: >60 mL/min (ref >60)
Glucose, Bld: 133 mg/dL — ABNORMAL HIGH (ref 70–99)
Potassium: 4.5 mmol/L (ref 3.5–5.1)
Sodium: 139 mmol/L (ref 135–145)

## 2020-06-16 MED ORDER — TRAMADOL HCL 50 MG PO TABS
50.0000 mg | ORAL_TABLET | Freq: Once | ORAL | Status: AC
  Administered 2020-06-16: 50 mg via ORAL
  Filled 2020-06-16: qty 1

## 2020-06-16 MED ORDER — TRAMADOL HCL 50 MG PO TABS: 50.0000 mg | ORAL_TABLET | Freq: Four times a day (QID) | ORAL | 0 refills | Status: AC | PRN

## 2020-06-16 NOTE — ED Notes (Signed)
This RN to bedside due to patient calling out. Pt requesting to know how much longer, pt's caregiver reports she is a sickle cell patient and she hasn't eaten. This RN explained visitation policy, caregiver refusing to leave patient at this time. This RN apologized and explained delay. Pt visualized in NAD at this time. Repeat blood work collected by this RN at this time.

## 2020-06-16 NOTE — ED Notes (Signed)
NAD noted at time of D/C. Pt denies questions or concerns. Pt ambulatory to the lobby at this time with his granddaughter. Verbal consent for D/C obtained by this RN.

## 2020-06-16 NOTE — ED Triage Notes (Signed)
Pt comes with c/o left shoulder pain from a fall on Monday. Pt tripped and fell on his left side. Pt denies any LOC or hitting head.  Pt states he then woke up this am and had some left sided CP. Pt states some pain when he takes breath.

## 2020-06-16 NOTE — ED Notes (Signed)
Pt c/o 2 mechanical falls on Monday. Pt states landed on L shoulder. Pt states increasing fatigue. Also c/o L sided chest/shoulder pain, worse with deep inspiration at this time. Pt A&O x4, NAD noted at this time. NSR on the monitor.

## 2020-06-16 NOTE — Progress Notes (Signed)
   06/16/20 1300  Clinical Encounter Type  Visited With Patient  Visit Type Initial;Spiritual support;Social support  Referral From Chaplain  Consult/Referral To Chaplain   Posey Boyer visited PT while doing rounds. PT stated he was in a lot of pain and "hoped his heart is ok".  PT was able to express his emotions. Chaplain ministered with emotional support, spiritual support, and prayer.

## 2020-06-16 NOTE — ED Provider Notes (Signed)
[facility] Emergency Department Provider Note ____________________________________________   Event Date/Time   First MD Initiated Contact with Patient 06/16/20 1127     (approximate)  I have reviewed the triage vital signs and the nursing notes.   HISTORY  Chief Complaint Chest Pain and Fall    HPI [name] is a 80 y.o. male with PMH as noted below including CAD status post CABG in 2007, hypertension, hyperlipidemia, and kidney stones who presents with left chest pain over the last 2 days, persistent course, worse with movement and certain positions.  He states that he was working outside 2 days ago and fell twice.  Initially he fell from standing height onto his left shoulder, and then fell a second time onto his back.  He did not hit his head or lose consciousness.  He denies any significant shoulder pain and is able to move and use it normally.  He denies any shortness of breath.  Past Medical History:  Diagnosis Date  . Arthritis   . CAD (coronary artery disease)    CABG 2007 with LIMA-LAD, SVG-D, sequential SVG-OM and CFX PDA. Myoview 12/09 was probably a negative study with attenuation artifact  . History of kidney stones late 105's early 40's  . HLD (hyperlipidemia)   . HTN (hypertension)   . Kidney stones   . Myocardial infarction (HCC) 2008   light  . Positional vertigo     Patient Active Problem List   Diagnosis Date Noted  . Pain of left hip joint 06/15/2016  . POSITIONAL VERTIGO 05/14/2009  . Hyperlipidemia 03/03/2009  . HYPERTENSION, BENIGN 03/03/2009  . CORONARY ATHEROSCLEROSIS OF ARTERY BYPASS GRAFT 03/03/2009    Past Surgical History:  Procedure Laterality Date  . bypass surgery (otheR)  August 2007  . COLONOSCOPY    . CORONARY ARTERY BYPASS GRAFT    . SHOULDER SURGERY    . TONSILLECTOMY      Prior to Admission medications   Medication Sig Start Date End Date Taking? Authorizing Provider  traMADol (ULTRAM) 50 MG  tablet Take 1 tablet (50 mg total) by mouth every 6 (six) hours as needed. 06/16/20 06/16/21 Yes Dionne Bucy, MD  acetaminophen (TYLENOL 8 HOUR) 650 MG CR tablet Take 1 tablet (650 mg total) by mouth every 8 (eight) hours as needed for pain or fever. 02/15/18   Derwood Kaplan, MD  amLODipine (NORVASC) 5 MG tablet Take 1 tablet (5 mg total) by mouth daily. 03/12/20   Antonieta Iba, MD  aspirin EC 81 MG tablet Take 1 tablet (81 mg total) by mouth daily. 10/05/17   Antonieta Iba, MD  Bilberry 1000 MG CAPS Take 1 capsule by mouth daily.    [provider]  ibuprofen (ADVIL,MOTRIN) 200 MG tablet Take 200 mg by mouth every 6 (six) hours as needed for headache or moderate pain.     [provider]  lisinopril (ZESTRIL) 20 MG tablet Take 1 tablet (20 mg total) by mouth daily. 03/12/20   Antonieta Iba, MD  metoprolol tartrate (LOPRESSOR) 25 MG tablet Take 1 tablet (25 mg total) by mouth 2 (two) times daily. 03/12/20   Antonieta Iba, MD  Multiple Vitamins-Minerals (ONE-A-DAY EXTRAS ANTIOXIDANT) CAPS Take by mouth daily.      [provider]  naproxen (NAPROSYN) 375 MG tablet Take 1 tablet (375 mg total) by mouth 2 (two) times daily. 02/15/18   Derwood Kaplan, MD  neomycin-bacitracin-polymyxin (NEOSPORIN) ointment Apply 1 application topically every 12 (twelve)  hours. 02/15/18   Derwood Kaplan, MD  simvastatin (ZOCOR) 40 MG tablet Take 1 tablet (40 mg total) by mouth daily at 6 PM. 05/20/20   Gollan, Tollie Pizza, MD  tamsulosin (FLOMAX) 0.4 MG CAPS capsule Take 0.4 mg by mouth daily. 12/04/19   [provider]  vitamin E 180 MG (400 UNITS) capsule Take 400 Units by mouth daily.    [provider]    Allergies Codeine  Family History  Problem Relation Age of Onset  . Alzheimer's disease Father   . Colon cancer Father   . Prostate cancer Father   . Diabetes Sister   . Prostate cancer Brother   . Diabetes Mother     Social History Social  History   Tobacco Use  . Smoking status: Former Games developer  . Smokeless tobacco: Never Used  Vaping Use  . Vaping Use: Never used  Substance Use Topics  . Alcohol use: No  . Drug use: No    Review of Systems  Constitutional: No fever. Eyes: No visual changes. ENT: No sore throat. Cardiovascular: Positive for chest pain. Respiratory: Denies shortness of breath. Gastrointestinal: No vomiting or diarrhea.  Genitourinary: Negative for flank pain. Musculoskeletal: Negative for back or shoulder pain. Skin: Negative for rash. Neurological: Negative for headaches, focal weakness or numbness.   ____________________________________________   PHYSICAL EXAM:  VITAL SIGNS: ED Triage Vitals  Enc Vitals Group     BP 06/16/20 1014 139/81     Pulse Rate 06/16/20 1014 69     Resp 06/16/20 1014 18     Temp 06/16/20 1014 97.6 F (36.4 C)     Temp Source 06/16/20 1014 Oral     SpO2 06/16/20 1014 94 %     Weight --      Height --      Head Circumference --      Peak Flow --      Pain Score 06/16/20 1016 8     Pain Loc --      Pain Edu? --      Excl. in GC? --     Constitutional: Alert and oriented. Well appearing and in no acute distress. Eyes: Conjunctivae are normal.  Head: Atraumatic. Nose: No congestion/rhinnorhea. Mouth/Throat: Mucous membranes are moist.   Neck: Normal range of motion.  No cervical spinal tenderness. Cardiovascular: Normal rate, regular rhythm. Grossly normal heart sounds.  Good peripheral circulation. Respiratory: Normal respiratory effort.  No retractions. Lungs CTAB. Gastrointestinal: No distention.  Musculoskeletal: No lower extremity edema.  Extremities warm and well perfused.  Left anterior chest wall tenderness to palpation.  Full range of motion of left shoulder with no bony tenderness or deformity.  No midline spinal tenderness. Neurologic:  Normal speech and language. No gross focal neurologic deficits are appreciated.  Skin:  Skin is warm and dry.  No rash noted. Psychiatric: Mood and affect are normal. Speech and behavior are normal.  ____________________________________________   LABS (all labs ordered are listed, but only abnormal results are displayed)  Labs Reviewed  BASIC METABOLIC PANEL - Abnormal; Notable for the following components:      Result Value   Glucose, Bld 133 (*)    BUN 24 (*)    Calcium 8.3 (*)    All other components within normal limits  CBC  TROPONIN I (HIGH SENSITIVITY)  TROPONIN I (HIGH SENSITIVITY)   ____________________________________________  EKG  ED ECG REPORT I, Dionne Bucy, the attending physician, personally viewed and interpreted this ECG.  Date: 06/16/2020  EKG Time: 1021 Rate: 63 Rhythm: normal sinus rhythm QRS Axis: Right axis Intervals: Prolonged PR ST/T Wave abnormalities: normal Narrative Interpretation: no evidence of acute ischemia  ____________________________________________  RADIOLOGY  Chest x-ray interpreted by me shows no focal consolidation or edema XR L ribs: No acute fracture  ____________________________________________   PROCEDURES  Procedure(s) performed: No  Procedures  Critical Care performed: No ____________________________________________   INITIAL IMPRESSION / ASSESSMENT AND PLAN / ED COURSE  Pertinent labs & imaging results that were available during my care of the patient were reviewed by me and considered in my medical decision making (see chart for details).  80 year old male with PMH as noted above including CAD status post CABG 15 years ago presents with atypical left chest wall pain after 2 back-to-back mechanical falls from standing height 2 days ago.  On exam, the patient is overall well-appearing.  His vital signs are normal.  He has left chest wall tenderness corresponding to the pain.  He has full range of motion of the left shoulder and no spinal tenderness.  Exam is otherwise unremarkable.  Overall presentation is  consistent with musculoskeletal left chest wall pain.  Overall I suspect most likely contusion rather than fracture.  Chest x-ray is negative.  I will add on a left rib series.  There is no clinical evidence for ACS, PE, or vascular etiology.  Troponins were ordered in triage.  If the work-up is negative I anticipate discharge home.  ----------------------------------------- 1:39 PM on 06/16/2020 -----------------------------------------  Rib series is negative, as is the second troponin.  The patient is stable for discharge home.  I counseled him on the results of the work-up.  Return precautions given, and he expresses understanding.  ____________________________________________   FINAL CLINICAL IMPRESSION(S) / ED DIAGNOSES  Final diagnoses:  Rib pain  Chest wall pain      NEW MEDICATIONS STARTED DURING THIS VISIT:  New Prescriptions   TRAMADOL (ULTRAM) 50 MG TABLET    Take 1 tablet (50 mg total) by mouth every 6 (six) hours as needed.     Note:  This document was prepared using Dragon voice recognition software and may include unintentional dictation errors.    Dionne Bucy, MD 06/16/20 1340

## 2020-06-16 NOTE — ED Notes (Signed)
Pt assisted to the bathroom at this time.

## 2020-12-22 ENCOUNTER — Other Ambulatory Visit: Payer: Self-pay | Admitting: Cardiovascular Disease

## 2021-04-25 ENCOUNTER — Other Ambulatory Visit: Payer: Self-pay | Admitting: Cardiovascular Disease

## 2021-04-25 NOTE — Telephone Encounter (Signed)
Attempted to schedule.  

## 2021-04-25 NOTE — Telephone Encounter (Signed)
Patient needs an appt.

## 2021-05-10 NOTE — Telephone Encounter (Signed)
Attempted to schedule.  LMOV to call office.  ° °

## 2021-05-13 NOTE — Telephone Encounter (Signed)
Scheduled

## 2021-06-13 NOTE — Progress Notes (Signed)
Cardiology Office Note ? ?Date:  06/14/2021  ? ?ID:  [name], DOB 1940-08-27, MRN [id number] ? ?PCP:  Kaleen Mask, MD  ? ?Chief Complaint  ?Patient presents with  ? 12 month follow up   ?  "Doing well." Medications reviewed by the patient verbally.   ? ? ?HPI:  ?[name] is a 81 yo gentleman with a h/o   ?coronary artery disease, HTN,  ?CABG in August of 2007 with a LIMA  to the LAD,  ?vein graft to the diagonal, and graft to the OM and PDA of the circumflex,  ?hypertension  ?hyperlipidemia.   ?Who presents for routine follow up of his coronary artery disease. ? ?Last seen in clinic February 2022 ?In follow-up reports feeling well, denies chest pain concerning for angina  ?No significant shortness of breath on exertion  ?active helping his brother with painting outside of his house ?Recently helped him to remodel inside of his house ? ?Blood pressure running low ?Rare lightheadedness ?Weight down 10 pounds in 3 years ? ?Has a girlfriend, 89 yo, she checks on him every day ? ?Son lives next door, does not talk with him ?Older brother, had following up, does not talk with him ? ?No edema ? ?EKG personally reviewed by myself on todays visit ?Shows normal sinus rhythm with rate 61 bpm no significant ST or T wave changes, PVCs ?  ?Other past medical history ?Previously had vertigo, improved with meclizine ?  ? ?PMH:   has a past medical history of Arthritis, CAD (coronary artery disease), History of kidney stones (late 70's early 80's), HLD (hyperlipidemia), HTN (hypertension), Kidney stones, Myocardial infarction (HCC) (2008), and Positional vertigo. ? ?PSH:    ?Past Surgical History:  ?Procedure Laterality Date  ? bypass surgery (otheR)  August 2007  ? COLONOSCOPY    ? CORONARY ARTERY BYPASS GRAFT    ? SHOULDER SURGERY    ? TONSILLECTOMY    ? ? ?Current Outpatient Medications  ?Medication Sig Dispense Refill  ? acetaminophen (TYLENOL 8 HOUR) 650 MG CR tablet Take 1 tablet (650 mg total) by mouth  every 8 (eight) hours as needed for pain or fever. 30 tablet 0  ? amLODipine (NORVASC) 5 MG tablet Take 1 tablet (5 mg total) by mouth daily. 90 tablet 3  ? aspirin EC 81 MG tablet Take 1 tablet (81 mg total) by mouth daily.    ? Bilberry 1000 MG CAPS Take 1 capsule by mouth daily.    ? ibuprofen (ADVIL,MOTRIN) 200 MG tablet Take 200 mg by mouth every 6 (six) hours as needed for headache or moderate pain.     ? lisinopril (ZESTRIL) 20 MG tablet TAKE 1 TABLET EVERY DAY 30 tablet 0  ? metoprolol tartrate (LOPRESSOR) 25 MG tablet TAKE 1 TABLET TWICE DAILY 60 tablet 0  ? Multiple Vitamins-Minerals (ONE-A-DAY EXTRAS ANTIOXIDANT) CAPS Take by mouth daily.      ? naproxen (NAPROSYN) 375 MG tablet Take 1 tablet (375 mg total) by mouth 2 (two) times daily. 10 tablet 0  ? neomycin-bacitracin-polymyxin (NEOSPORIN) ointment Apply 1 application topically every 12 (twelve) hours. 15 g 0  ? simvastatin (ZOCOR) 40 MG tablet Take 1 tablet (40 mg total) by mouth daily at 6 PM. 90 tablet 2  ? tamsulosin (FLOMAX) 0.4 MG CAPS capsule Take 0.4 mg by mouth daily.    ? traMADol (ULTRAM) 50 MG tablet Take 1 tablet (50 mg total) by mouth every 6 (six) hours as needed. 15 tablet  0  ? vitamin E 180 MG (400 UNITS) capsule Take 400 Units by mouth daily.    ? ?No current facility-administered medications for this visit.  ? ? ?Allergies:   Codeine  ? ?Social History:  The patient  reports that he has quit smoking. He has never used smokeless tobacco. He reports that he does not drink alcohol and does not use drugs.  ? ?Family History:   family history includes Alzheimer's disease in his father; Colon cancer in his father; Diabetes in his mother and sister; Prostate cancer in his brother and father.  ? ? ?Review of Systems: ?Review of Systems  ?Constitutional: Negative.   ?Respiratory: Negative.    ?Cardiovascular: Negative.   ?Gastrointestinal: Negative.   ?Musculoskeletal:  Positive for back pain and joint pain.  ?Neurological: Negative.    ?Psychiatric/Behavioral: Negative.    ?All other systems reviewed and are negative. ? ? ?PHYSICAL EXAM: ?VS:  BP 104/70 (BP Location: Left Arm, Patient Position: Sitting, Cuff Size: Normal)   Pulse 61   Ht 6' 2.5" (1.892 m)   Wt 198 lb 8 oz (90 kg)   SpO2 97%   BMI 25.14 kg/m?  , BMI Body mass index is 25.14 kg/m?Marland Kitchen ?Constitutional:  oriented to person, place, and time. No distress.  ?HENT:  ?Head: Grossly normal ?Eyes:  no discharge. No scleral icterus.  ?Neck: No JVD, no carotid bruits  ?Cardiovascular: Regular rate and rhythm, no murmurs appreciated ?Pulmonary/Chest: Clear to auscultation bilaterally, no wheezes or rails ?Abdominal: Soft.  no distension.  no tenderness.  ?Musculoskeletal: Normal range of motion ?Neurological:  normal muscle tone. Coordination normal. No atrophy ?Skin: Skin warm and dry ?Psychiatric: normal affect, pleasant ? ?Recent Labs: ?06/16/2020: BUN 24; Creatinine, Ser 0.86; Hemoglobin 15.9; Platelets 252; Potassium 4.5; Sodium 139  ? ? ?Lipid Panel ?Lab Results  ?Component Value Date  ? CHOL 112 03/12/2020  ? HDL 42 03/12/2020  ? LDLCALC 57 03/12/2020  ? TRIG 60 03/12/2020  ? ? ?Wt Readings from Last 3 Encounters:  ?06/14/21 198 lb 8 oz (90 kg)  ?03/12/20 202 lb (91.6 kg)  ?10/15/18 208 lb 8 oz (94.6 kg)  ?  ? ?ASSESSMENT AND PLAN: ? ?Atherosclerosis of coronary artery bypass graft of native heart without angina pectoris - Plan: EKG 12-Lead ?Currently with no symptoms of angina. No further workup at this time. Continue current medication regimen. ? ?HYPERTENSION, BENIGN - Plan: EKG 12-Lead ?Low, stable, active ?Recent weight loss may be contributing to lower blood pressure ?Recommend he stay hydrated ?For any worsening orthostasis symptoms may need to decrease his medication dosing, this was discussed ? ?Hyperlipidemia ?Cholesterol is at goal on the current lipid regimen. No changes to the medications were made. ? ?Back pain ?Active ,  ?rare NSAIDS ? ? Total encounter time more than 30  minutes ? Greater than 50% was spent in counseling and coordination of care with the patient ? ?No orders of the defined types were placed in this encounter. ? ? ? ?Signed, ?Dossie Arbour, M.D., Ph.D. ?06/14/2021  ?[facility], [place] ?[phone] ? ?

## 2021-06-14 ENCOUNTER — Encounter: Payer: Self-pay | Admitting: Cardiovascular Disease

## 2021-06-14 ENCOUNTER — Ambulatory Visit: Payer: Medicare HMO | Admitting: Cardiovascular Disease

## 2021-06-14 VITALS — BP 104/70 | HR 61 | Ht 74.5 in | Wt 198.5 lb

## 2021-06-14 DIAGNOSIS — I10 Essential (primary) hypertension: Secondary | ICD-10-CM

## 2021-06-14 DIAGNOSIS — E782 Mixed hyperlipidemia: Secondary | ICD-10-CM

## 2021-06-14 DIAGNOSIS — I25708 Atherosclerosis of coronary artery bypass graft(s), unspecified, with other forms of angina pectoris: Secondary | ICD-10-CM | POA: Diagnosis not present

## 2021-06-14 MED ORDER — METOPROLOL TARTRATE 25 MG PO TABS: 25.0000 mg | ORAL_TABLET | Freq: Two times a day (BID) | ORAL | 3 refills | Status: DC

## 2021-06-14 MED ORDER — LISINOPRIL 20 MG PO TABS: 20.0000 mg | ORAL_TABLET | Freq: Every day | ORAL | 3 refills | Status: DC

## 2021-06-14 NOTE — Patient Instructions (Signed)
Medication Instructions:  No changes  If you need a refill on your cardiac medications before your next appointment, please call your pharmacy.   Lab work: No new labs needed  Testing/Procedures: No new testing needed  Follow-Up: At [facility], you and your health needs are our priority.  As part of our continuing mission to provide you with exceptional heart care, we have created designated Provider Care Teams.  These Care Teams include your primary Cardiologist (physician) and Advanced Practice Providers (APPs -  Physician Assistants and Nurse Practitioners) who all work together to provide you with the care you need, when you need it.  You will need a follow up appointment in 12 months  Providers on your designated Care Team:   Christopher Berge, NP Ryan Dunn, PA-C Cadence Furth, PA-C  COVID-19 Vaccine Information can be found at: [email or web address] For questions related to vaccine distribution or appointments, please email [email or web address] or call [phone].   

## 2021-07-14 ENCOUNTER — Other Ambulatory Visit: Payer: Self-pay | Admitting: Family Medicine

## 2021-07-14 DIAGNOSIS — Z136 Encounter for screening for cardiovascular disorders: Secondary | ICD-10-CM

## 2021-07-22 ENCOUNTER — Ambulatory Visit
Admission: RE | Admit: 2021-07-22 | Discharge: 2021-07-22 | Disposition: A | Discharge status: Home / Self Care | Payer: Medicare HMO | Source: Ambulatory Visit | Attending: Family Medicine | Admitting: Family Medicine

## 2021-07-22 DIAGNOSIS — Z136 Encounter for screening for cardiovascular disorders: Secondary | ICD-10-CM

## 2021-08-01 ENCOUNTER — Encounter: Payer: Self-pay | Admitting: Surgery

## 2021-08-01 ENCOUNTER — Other Ambulatory Visit: Payer: Self-pay

## 2021-08-01 ENCOUNTER — Ambulatory Visit: Payer: Medicare HMO | Admitting: Surgery

## 2021-08-01 VITALS — BP 162/79 | HR 58 | Temp 98.1°F | Resp 20 | Ht 74.5 in | Wt 198.0 lb

## 2021-08-01 DIAGNOSIS — I7143 Infrarenal abdominal aortic aneurysm, without rupture: Secondary | ICD-10-CM | POA: Diagnosis not present

## 2021-08-01 DIAGNOSIS — I25708 Atherosclerosis of coronary artery bypass graft(s), unspecified, with other forms of angina pectoris: Secondary | ICD-10-CM

## 2021-08-01 DIAGNOSIS — I63233 Cerebral infarction due to unspecified occlusion or stenosis of bilateral carotid arteries: Secondary | ICD-10-CM

## 2021-08-01 DIAGNOSIS — I6523 Occlusion and stenosis of bilateral carotid arteries: Secondary | ICD-10-CM

## 2021-08-01 NOTE — Progress Notes (Signed)
[facility] [place]  Patient name: [name] MRN: [id number] DOB: Mar 29, 1940 Sex: male   REQUESTING PROVIDER:    Dr. Jeannetta Nap   REASON FOR CONSULT:    AAA  HISTORY OF PRESENT ILLNESS:   [name] is a 81 y.o. 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  Initially in 2010, the patient had a ultrasound that showed a 3.1 cm aneurysm in his infrarenal abdominal aorta.  A follow-up study was performed in June 2023 that showed a 5 cm infrarenal aneurysm.  He is here today for follow-up.  He denies any abdominal pain or back pain.  His only complaint is that of a bulge underneath his sternotomy incision   The patient has a history of coronary artery disease.  He is status post CABG in 2007.  He is a former smoker.  He is on a statin for hypercholesterolemia.  He is medically managed for hypertension.  PAST MEDICAL HISTORY    Past Medical History:  Diagnosis Date   Arthritis    CAD (coronary artery disease)    CABG 2007 with LIMA-LAD, SVG-D, sequential SVG-OM and CFX PDA. Myoview 12/09 was probably a negative study with attenuation artifact   History of kidney stones late 70's early 80's   HLD (hyperlipidemia)    HTN (hypertension)    Kidney stones    Myocardial infarction (HCC) 2008   light   Positional vertigo      FAMILY HISTORY   Family History  Problem Relation Age of Onset   Alzheimer's disease Father    Colon cancer Father    Prostate cancer Father    Diabetes Sister    Prostate cancer Brother    Diabetes Mother     SOCIAL HISTORY:   Social History   Socioeconomic History   Marital status: Married    Spouse name: Not on file   Number of children: 2   Years of education: Not on file   Highest education level: Not on file  Occupational History   Occupation: Retired  Tobacco Use   Smoking status: Former   Smokeless tobacco: Never  Building services engineer Use: Never  used  Substance and Sexual Activity   Alcohol use: No   Drug use: No   Sexual activity: Not on file  Other Topics Concern   Not on file  Social History Narrative   Retired. Regularly exercise.    Social Determinants of Health   Financial Resource Strain: Not on file  Food Insecurity: Not on file  Transportation Needs: Not on file  Physical Activity: Not on file  Stress: Not on file  Social Connections: Not on file  Intimate Partner Violence: Not on file    ALLERGIES:    Allergies  Allergen Reactions   Codeine Nausea And Vomiting    CURRENT MEDICATIONS:    Current Outpatient Medications  Medication Sig Dispense Refill   acetaminophen (TYLENOL 8 HOUR) 650 MG CR tablet Take 1 tablet (650 mg total) by mouth every 8 (eight) hours as needed for pain or fever. 30 tablet 0   amLODipine (NORVASC) 5 MG tablet Take 1 tablet (5 mg total) by mouth daily. 90 tablet 3   aspirin EC 81 MG tablet Take 1 tablet (81 mg total) by mouth daily.     Bilberry 1000 MG CAPS Take 1 capsule by mouth daily.     ibuprofen (ADVIL,MOTRIN) 200 MG tablet Take 200 mg by mouth every 6 (  six) hours as needed for headache or moderate pain.      lisinopril (ZESTRIL) 20 MG tablet Take 1 tablet (20 mg total) by mouth daily. 90 tablet 3   metoprolol tartrate (LOPRESSOR) 25 MG tablet Take 1 tablet (25 mg total) by mouth 2 (two) times daily. 180 tablet 3   Multiple Vitamins-Minerals (ONE-A-DAY EXTRAS ANTIOXIDANT) CAPS Take by mouth daily.       naproxen (NAPROSYN) 375 MG tablet Take 1 tablet (375 mg total) by mouth 2 (two) times daily. 10 tablet 0   neomycin-bacitracin-polymyxin (NEOSPORIN) ointment Apply 1 application topically every 12 (twelve) hours. 15 g 0   simvastatin (ZOCOR) 40 MG tablet Take 1 tablet (40 mg total) by mouth daily at 6 PM. 90 tablet 2   tamsulosin (FLOMAX) 0.4 MG CAPS capsule Take 0.4 mg by mouth daily.     vitamin E 180 MG (400 UNITS) capsule Take 400 Units by mouth daily.     No current  facility-administered medications for this visit.    REVIEW OF SYSTEMS:   [X]  denotes positive finding, [ ]  denotes negative finding Cardiac  Comments:  Chest pain or chest pressure:    Shortness of breath upon exertion:    Short of breath when lying flat:    Irregular heart rhythm:        Vascular    Pain in calf, thigh, or hip brought on by ambulation:    Pain in feet at night that wakes you up from your sleep:     Blood clot in your veins:    Leg swelling:         Pulmonary    Oxygen at home:    Productive cough:     Wheezing:         Neurologic    Sudden weakness in arms or legs:     Sudden numbness in arms or legs:     Sudden onset of difficulty speaking or slurred speech:    Temporary loss of vision in one eye:     Problems with dizziness:         Gastrointestinal    Blood in stool:      Vomited blood:         Genitourinary    Burning when urinating:     Blood in urine:        Psychiatric    Major depression:         Hematologic    Bleeding problems:    Problems with blood clotting too easily:        Skin    Rashes or ulcers:        Constitutional    Fever or chills:     PHYSICAL EXAM:   There were no vitals filed for this visit.  GENERAL: The patient is a well-nourished male, in no acute distress. The vital signs are documented above. CARDIAC: There is a regular rate and rhythm.  VASCULAR: I could not palpate pedal pulses PULMONARY: Nonlabored respirations ABDOMEN: Soft and non-tender.  With reducible hernia likely from chest tube MUSCULOSKELETAL: There are no major deformities or cyanosis. NEUROLOGIC: No focal weakness or paresthesias are detected. SKIN: There are no ulcers or rashes noted. PSYCHIATRIC: The patient has a normal affect.  STUDIES:   I reviewed the following ultrasound:  1. 5 cm abdominal aortic aneurysm. (Previously measured 3.7 x 3.3 cm) Recommend  referral to a vascular specialist. 2. There is low peak systolic velocity  in the abdominal aorta which  can be seen in near occlusive settings.  ASSESSMENT and PLAN   AAA: Most recent ultrasound suggested a 5 cm infrarenal abdominal aortic aneurysm.  I discussed with the patient that a more detailed evaluation of his aneurysm is necessary with a CT angiogram.  This will help me to determine whether or not he is a candidate for endovascular repair, and confirm the true size of his aneurysm.  I will get this scheduled in the near future and have him return for follow-up.  He will also need carotid studies and ABIs for preoperative evaluation.  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  It is fully reducible and nontender.  He is interested in getting this repaired.  Once his aneurysm has been addressed, I will refer him to general surgery.   Charlena Cross, MD, FACS [facility] [phone] Pager [phone]

## 2021-08-04 ENCOUNTER — Other Ambulatory Visit: Payer: Self-pay

## 2021-08-04 DIAGNOSIS — I7143 Infrarenal abdominal aortic aneurysm, without rupture: Secondary | ICD-10-CM

## 2021-08-05 ENCOUNTER — Other Ambulatory Visit: Payer: Self-pay | Admitting: Family Medicine

## 2021-08-05 DIAGNOSIS — K439 Ventral hernia without obstruction or gangrene: Secondary | ICD-10-CM

## 2021-08-11 ENCOUNTER — Ambulatory Visit (HOSPITAL_COMMUNITY)
Admission: RE | Admit: 2021-08-11 | Discharge: 2021-08-11 | Disposition: A | Discharge status: Home / Self Care | Payer: Medicare HMO | Source: Ambulatory Visit | Attending: Vascular Surgery | Admitting: Vascular Surgery

## 2021-08-11 ENCOUNTER — Ambulatory Visit (INDEPENDENT_AMBULATORY_CARE_PROVIDER_SITE_OTHER)
Admission: RE | Admit: 2021-08-11 | Discharge: 2021-08-11 | Disposition: A | Discharge status: Home / Self Care | Payer: Medicare HMO | Source: Ambulatory Visit | Attending: Vascular Surgery | Admitting: Vascular Surgery

## 2021-08-11 DIAGNOSIS — I7143 Infrarenal abdominal aortic aneurysm, without rupture: Secondary | ICD-10-CM | POA: Insufficient documentation

## 2021-08-11 DIAGNOSIS — I6523 Occlusion and stenosis of bilateral carotid arteries: Secondary | ICD-10-CM | POA: Insufficient documentation

## 2021-08-11 DIAGNOSIS — I25708 Atherosclerosis of coronary artery bypass graft(s), unspecified, with other forms of angina pectoris: Secondary | ICD-10-CM

## 2021-08-11 DIAGNOSIS — I739 Peripheral vascular disease, unspecified: Secondary | ICD-10-CM | POA: Diagnosis not present

## 2021-08-12 ENCOUNTER — Ambulatory Visit (HOSPITAL_COMMUNITY)
Admission: RE | Admit: 2021-08-12 | Discharge: 2021-08-12 | Disposition: A | Discharge status: Home / Self Care | Payer: Medicare HMO | Source: Ambulatory Visit | Attending: Surgery | Admitting: Surgery

## 2021-08-12 DIAGNOSIS — I7143 Infrarenal abdominal aortic aneurysm, without rupture: Secondary | ICD-10-CM | POA: Diagnosis not present

## 2021-08-12 MED ORDER — IOHEXOL 350 MG/ML SOLN
100.0000 mL | Freq: Once | INTRAVENOUS | Status: AC | PRN
  Administered 2021-08-12: 100 mL via INTRAVENOUS

## 2021-08-15 ENCOUNTER — Encounter: Payer: Self-pay | Admitting: Surgery

## 2021-08-15 ENCOUNTER — Ambulatory Visit: Payer: Medicare HMO | Admitting: Surgery

## 2021-08-15 VITALS — BP 117/71 | HR 76 | Temp 98.1°F | Resp 20 | Ht 74.5 in | Wt 201.0 lb

## 2021-08-15 DIAGNOSIS — I7143 Infrarenal abdominal aortic aneurysm, without rupture: Secondary | ICD-10-CM

## 2021-08-15 LAB — POCT I-STAT CREATININE: Creatinine, Ser: 1.2 mg/dL (ref 0.61–1.24)

## 2021-08-15 NOTE — Progress Notes (Signed)
[facility] [place]  Patient name: [name] MRN: [id number] DOB: [birth year] Sex: male   REASON FOR VISIT:    Follow up  HISOTRY OF PRESENT ILLNESS:   [name] is a 81 y.o. 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  Initially in 2010, the patient had a ultrasound that showed a 3.1 cm aneurysm in his infrarenal abdominal aorta.  A follow-up study was performed in June 2023 that showed a 5 cm infrarenal aneurysm.  I sent him for a CT angiogram to correlate with the ultrasound and define his anatomy.  He denies any abdominal pain or back pain.  His only complaint is that of a bulge underneath his sternotomy incision     The patient has a history of coronary artery disease.  He is status post CABG in 2007.  He is a former smoker.  He is on a statin for hypercholesterolemia.  He is medically managed for hypertension.    PAST MEDICAL HISTORY:   Past Medical History:  Diagnosis Date   Arthritis    CAD (coronary artery disease)    CABG 2007 with LIMA-LAD, SVG-D, sequential SVG-OM and CFX PDA. Myoview 12/09 was probably a negative study with attenuation artifact   History of kidney stones late 70's early 80's   HLD (hyperlipidemia)    HTN (hypertension)    Kidney stones    Myocardial infarction (HCC) 2008   light   Positional vertigo      FAMILY HISTORY:   Family History  Problem Relation Age of Onset   Alzheimer's disease Father    Colon cancer Father    Prostate cancer Father    Diabetes Sister    Prostate cancer Brother    Diabetes Mother     SOCIAL HISTORY:   Social History   Tobacco Use   Smoking status: Former   Smokeless tobacco: Never  Substance Use Topics   Alcohol use: No     ALLERGIES:   Allergies  Allergen Reactions   Codeine Nausea And Vomiting     CURRENT MEDICATIONS:   Current Outpatient Medications  Medication Sig Dispense Refill    acetaminophen (TYLENOL 8 HOUR) 650 MG CR tablet Take 1 tablet (650 mg total) by mouth every 8 (eight) hours as needed for pain or fever. 30 tablet 0   amLODipine (NORVASC) 5 MG tablet Take 1 tablet (5 mg total) by mouth daily. 90 tablet 3   aspirin EC 81 MG tablet Take 1 tablet (81 mg total) by mouth daily.     Bilberry 1000 MG CAPS Take 1 capsule by mouth daily.     ibuprofen (ADVIL,MOTRIN) 200 MG tablet Take 200 mg by mouth every 6 (six) hours as needed for headache or moderate pain.      lisinopril (ZESTRIL) 20 MG tablet Take 1 tablet (20 mg total) by mouth daily. 90 tablet 3   metoprolol tartrate (LOPRESSOR) 25 MG tablet Take 1 tablet (25 mg total) by mouth 2 (two) times daily. 180 tablet 3   Multiple Vitamins-Minerals (ONE-A-DAY EXTRAS ANTIOXIDANT) CAPS Take by mouth daily.       naproxen (NAPROSYN) 375 MG tablet Take 1 tablet (375 mg total) by mouth 2 (two) times daily. 10 tablet 0   neomycin-bacitracin-polymyxin (NEOSPORIN) ointment Apply 1 application topically every 12 (twelve) hours. 15 g 0   simvastatin (ZOCOR) 40 MG tablet Take 1 tablet (40 mg total) by mouth daily at 6 PM. 90 tablet 2  tamsulosin (FLOMAX) 0.4 MG CAPS capsule Take 0.4 mg by mouth daily.     vitamin E 180 MG (400 UNITS) capsule Take 400 Units by mouth daily.     No current facility-administered medications for this visit.    REVIEW OF SYSTEMS:   [X]  denotes positive finding, [ ]  denotes negative finding Cardiac  Comments:  Chest pain or chest pressure:    Shortness of breath upon exertion:    Short of breath when lying flat:    Irregular heart rhythm:        Vascular    Pain in calf, thigh, or hip brought on by ambulation:    Pain in feet at night that wakes you up from your sleep:     Blood clot in your veins:    Leg swelling:         Pulmonary    Oxygen at home:    Productive cough:     Wheezing:         Neurologic    Sudden weakness in arms or legs:     Sudden numbness in arms or legs:      Sudden onset of difficulty speaking or slurred speech:    Temporary loss of vision in one eye:     Problems with dizziness:         Gastrointestinal    Blood in stool:     Vomited blood:         Genitourinary    Burning when urinating:     Blood in urine:        Psychiatric    Major depression:         Hematologic    Bleeding problems:    Problems with blood clotting too easily:        Skin    Rashes or ulcers:        Constitutional    Fever or chills:      PHYSICAL EXAM:   Vitals:   08/15/21 1107  BP: 117/71  Pulse: 76  Resp: 20  Temp: 98.1 F (36.7 C)  SpO2: 95%  Weight: 201 lb (91.2 kg)  Height: 6' 2.5" (1.892 m)    GENERAL: The patient is a well-nourished male, in no acute distress. The vital signs are documented above. CARDIAC: There is a regular rate and rhythm.  PULMONARY: Non-labored respirations ABDOMEN: Soft and non-tender with normal pitched bowel sounds.  MUSCULOSKELETAL: There are no major deformities or cyanosis. NEUROLOGIC: No focal weakness or paresthesias are detected. SKIN: There are no ulcers or rashes noted. PSYCHIATRIC: The patient has a normal affect.  STUDIES:   I have reviewed the following: Carotid duplex: No significant stenosis (1-39% bilaterally) +-------+-----------+-----------+------------+------------+  ABI/TBIToday's ABIToday's TBIPrevious ABIPrevious TBI  +-------+-----------+-----------+------------+------------+  Right  0.76       0.41                                 +-------+-----------+-----------+------------+------------+  Left   0.67       0.26                                 +-------+-----------+-----------+------------+------------+   CTA: Unfortunately this is not been read by radiology yet because of some imaging issues.  By my measurements, the aneurysm is around 4.2 cm  MEDICAL ISSUES:   AAA: Less than 5 cm abdominal aortic aneurysm.  The patient is asymptomatic.  I discussed with him  that we will need to follow this and if it becomes greater than 5 cm we will consider repair.  Based off the images today I think he would be a candidate for endovascular repair.  He will come back in 6 months with a repeat ultrasound.    Charlena Cross, MD, FACS [facility] [phone] Pager [phone]

## 2021-08-23 ENCOUNTER — Other Ambulatory Visit: Payer: Self-pay

## 2021-08-23 DIAGNOSIS — I7143 Infrarenal abdominal aortic aneurysm, without rupture: Secondary | ICD-10-CM

## 2021-12-07 ENCOUNTER — Encounter: Payer: Self-pay | Admitting: Gastroenterology

## 2022-02-23 ENCOUNTER — Encounter: Payer: Self-pay | Admitting: Nurse Practitioner

## 2022-02-23 ENCOUNTER — Ambulatory Visit (INDEPENDENT_AMBULATORY_CARE_PROVIDER_SITE_OTHER): Payer: Medicare HMO | Admitting: Nurse Practitioner

## 2022-02-23 VITALS — BP 140/90 | HR 66 | Ht 74.5 in | Wt 210.0 lb

## 2022-02-23 DIAGNOSIS — I10 Essential (primary) hypertension: Secondary | ICD-10-CM

## 2022-02-23 DIAGNOSIS — E785 Hyperlipidemia, unspecified: Secondary | ICD-10-CM

## 2022-02-23 DIAGNOSIS — I714 Abdominal aortic aneurysm, without rupture, unspecified: Secondary | ICD-10-CM

## 2022-02-23 DIAGNOSIS — K8689 Other specified diseases of pancreas: Secondary | ICD-10-CM

## 2022-02-23 DIAGNOSIS — N400 Enlarged prostate without lower urinary tract symptoms: Secondary | ICD-10-CM

## 2022-02-23 LAB — COMPREHENSIVE METABOLIC PANEL
ALT: 17 U/L (ref 0–53)
AST: 21 U/L (ref 0–37)
Albumin: 4 g/dL (ref 3.5–5.2)
Alkaline Phosphatase: 64 U/L (ref 39–117)
BUN: 20 mg/dL (ref 6–23)
CO2: 26 mEq/L (ref 19–32)
Calcium: 8.3 mg/dL — ABNORMAL LOW (ref 8.4–10.5)
Chloride: 105 mEq/L (ref 96–112)
Creatinine, Ser: 1.12 mg/dL (ref 0.40–1.50)
GFR: 60.82 mL/min (ref >60.00)
Glucose, Bld: 114 mg/dL — ABNORMAL HIGH (ref 70–99)
Potassium: 4.7 mEq/L (ref 3.5–5.1)
Sodium: 140 mEq/L (ref 135–145)
Total Bilirubin: 0.7 mg/dL (ref 0.2–1.2)
Total Protein: 6.3 g/dL (ref 6.0–8.3)

## 2022-02-23 LAB — CBC
HCT: 44.4 % (ref 39.0–52.0)
Hemoglobin: 14.9 g/dL (ref 13.0–17.0)
MCHC: 33.6 g/dL (ref 30.0–36.0)
MCV: 97.6 fl (ref 78.0–100.0)
Platelets: 268 10*3/uL (ref 150.0–400.0)
RBC: 4.55 Mil/uL (ref 4.22–5.81)
RDW: 14.7 % (ref 11.5–15.5)
WBC: 8 10*3/uL (ref 4.0–10.5)

## 2022-02-23 LAB — LIPID PANEL
Cholesterol: 110 mg/dL (ref 0–200)
HDL: 35.7 mg/dL — ABNORMAL LOW (ref >39.00)
LDL Cholesterol: 58 mg/dL (ref 0–99)
NonHDL: 74.61
Total CHOL/HDL Ratio: 3
Triglycerides: 85 mg/dL (ref 0.0–149.0)
VLDL: 17 mg/dL (ref 0.0–40.0)

## 2022-02-23 LAB — TSH: TSH: 2.76 u[IU]/mL (ref 0.35–5.50)

## 2022-02-23 NOTE — Assessment & Plan Note (Signed)
Noted on CT scan.  Patient is already on tamsulosin and does well.  Continue medication as prescribed

## 2022-02-23 NOTE — Patient Instructions (Signed)
Nice to see you today I will be in touch with the labs once I have them I want to see you in 6 months for your physical, sooner if you need me

## 2022-02-23 NOTE — Assessment & Plan Note (Addendum)
Patient's blood pressure slightly above goal.  He is taking his medications for the day.  Patient is currently maintained on metoprolol, amlodipine, and lisinopril.  Continue taking medication as prescribed follow-up with cardiology as recommended

## 2022-02-23 NOTE — Assessment & Plan Note (Signed)
Patient currently maintained on simvastatin.  Continue taking medication as prescribed follow-up with cardiology as recommended

## 2022-02-23 NOTE — Progress Notes (Signed)
New Patient Office Visit  Subjective    Patient ID: [name], male    DOB: 11-Mar-1940  Age: 82 y.o. MRN: [id number]  CC:  Chief Complaint  Patient presents with   Establish Care    HPI [name] presents to establish care  Previous PCP: Claris Gower, MD Cardiologist: Ida Rogue, MD Vascular Surgeon: Harold Barban, MD  HTN: currently on amlodipine, lisinopril, metoprolol. States that he checks twice a day. States 79-80 on the bottm. States that it is 100-140  HLD: currently on simvastatin. He is followed by cardiology    Abdominal aortic aneurysm: followed by vascular surgery. 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 Recommended follow up MRI for further characterizatoin   Prostatomegaly: Noted on CTA done 07/2021. Recommended DRE for further assessment    TDAP 2020 Flu: up to date  PSA: aged out Colonoscopy: 2013, aged out. States that he does not want to pursue  Advanced Directive: does have one. State that his girlfriend knows about what to do  Drinks fruits juices and some    Outpatient Encounter Medications as of 02/23/2022  Medication Sig   acetaminophen (TYLENOL 8 HOUR) 650 MG CR tablet Take 1 tablet (650 mg total) by mouth every 8 (eight) hours as needed for pain or fever.   amLODipine (NORVASC) 5 MG tablet Take 1 tablet (5 mg total) by mouth daily.   aspirin EC 81 MG tablet Take 1 tablet (81 mg total) by mouth daily.   Bilberry 1000 MG CAPS Take 1 capsule by mouth daily.   ibuprofen (ADVIL,MOTRIN) 200 MG tablet Take 200 mg by mouth every 6 (six) hours as needed for headache or moderate pain.    lisinopril (ZESTRIL) 20 MG tablet Take 1 tablet (20 mg total) by mouth daily.   metoprolol tartrate (LOPRESSOR) 25 MG tablet Take 1 tablet (25 mg total) by mouth 2 (two) times daily.   Multiple Vitamins-Minerals (ONE-A-DAY EXTRAS ANTIOXIDANT) CAPS Take by mouth daily.     naproxen (NAPROSYN) 375 MG tablet Take 1 tablet  (375 mg total) by mouth 2 (two) times daily.   neomycin-bacitracin-polymyxin (NEOSPORIN) ointment Apply 1 application topically every 12 (twelve) hours.   simvastatin (ZOCOR) 40 MG tablet Take 1 tablet (40 mg total) by mouth daily at 6 PM.   tamsulosin (FLOMAX) 0.4 MG CAPS capsule Take 0.4 mg by mouth daily.   vitamin E 180 MG (400 UNITS) capsule Take 400 Units by mouth daily.   No facility-administered encounter medications on file as of 02/23/2022.    Past Medical History:  Diagnosis Date   Arthritis    CAD (coronary artery disease)    CABG 2007 with LIMA-LAD, SVG-D, sequential SVG-OM and CFX PDA. Myoview 12/09 was probably a negative study with attenuation artifact   History of kidney stones late 70's early 80's   HLD (hyperlipidemia)    HTN (hypertension)    Kidney stones    Myocardial infarction [facility]) 2008   light   Positional vertigo     Past Surgical History:  Procedure Laterality Date   bypass surgery (otheR)  August 2007   COLONOSCOPY     CORONARY ARTERY BYPASS GRAFT     SHOULDER SURGERY     TONSILLECTOMY      Family History  Problem Relation Age of Onset   Alzheimer's disease Father    Colon cancer Father    Prostate cancer Father    Diabetes Sister    Prostate  cancer Brother    Diabetes Mother     Social History   Socioeconomic History   Marital status: Widowed    Spouse name: Not on file   Number of children: 2   Years of education: Not on file   Highest education level: Not on file  Occupational History   Occupation: Retired  Tobacco Use   Smoking status: Former    Types: Cigarettes   Smokeless tobacco: Never   Tobacco comments:    Quit in 40  Vaping Use   Vaping Use: Never used  Substance and Sexual Activity   Alcohol use: No   Drug use: No   Sexual activity: Not on file  Other Topics Concern   Not on file  Social History Narrative   Retired. Regularly exercise.    Social Determinants of Health   Financial Resource Strain: Not on  file  Food Insecurity: Not on file  Transportation Needs: Not on file  Physical Activity: Not on file  Stress: Not on file  Social Connections: Not on file  Intimate Partner Violence: Not on file    Review of Systems  Constitutional:  Negative for chills and fever.  Respiratory:  Negative for shortness of breath.   Cardiovascular:  Negative for chest pain.  Gastrointestinal:        BM daily  Takes some benfiber  Genitourinary:        Trouble getting stream started.   Nocturia x1  Neurological:  Negative for headaches.  Psychiatric/Behavioral:  Negative for hallucinations and suicidal ideas.         Objective    BP (!) 140/90   Pulse 66   Ht 6' 2.5" (1.892 m)   Wt 210 lb (95.3 kg)   SpO2 97%   BMI 26.60 kg/m   Physical Exam Vitals and nursing note reviewed.  Constitutional:      Appearance: Normal appearance.  HENT:     Right Ear: Tympanic membrane, ear canal and external ear normal.     Left Ear: Tympanic membrane, ear canal and external ear normal.     Mouth/Throat:     Mouth: Mucous membranes are moist.     Pharynx: Oropharynx is clear.  Eyes:     Extraocular Movements: Extraocular movements intact.     Pupils: Pupils are equal, round, and reactive to light.  Cardiovascular:     Rate and Rhythm: Normal rate and regular rhythm.     Heart sounds: Normal heart sounds.  Pulmonary:     Effort: Pulmonary effort is normal.     Breath sounds: Normal breath sounds.  Musculoskeletal:     Right lower leg: 1+ Pitting Edema present.     Left lower leg: 1+ Pitting Edema present.  Lymphadenopathy:     Cervical: No cervical adenopathy.  Neurological:     Mental Status: He is alert.         Assessment & Plan:   Problem List Items Addressed This Visit       Cardiovascular and Mediastinum   HYPERTENSION, BENIGN - Primary    Patient's blood pressure slightly above goal.  He is taking his medications for the day.  Patient is currently maintained on  metoprolol, amlodipine, and lisinopril.  Continue taking medication as prescribed follow-up with cardiology as recommended      Relevant Orders   CBC   Comprehensive metabolic panel   TSH   Abdominal aortic aneurysm (AAA) without rupture [facility])    Patient is followed by vascular surgery.  Recent CT scan in July 2023.  With recommendation to repeat in 6 months.  Continue following vascular surgery as recommended        Other   Hyperlipidemia    Patient currently maintained on simvastatin.  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  Did discuss with patient the possibility of getting an MRI that being a tumor.  Patient states he does not want to pursue treatment at this juncture given his age.  He declined MRI at this office visit      Enlarged prostate    Noted on CT scan.  Patient is already on tamsulosin and does well.  Continue medication as prescribed       Return in about 6 months (around 08/24/2022) for CPE and Labs.   Romilda Garret, NP

## 2022-02-23 NOTE — Assessment & Plan Note (Signed)
Noticed on recent CT scan.  Did discuss with patient the possibility of getting an MRI that being a tumor.  Patient states he does not want to pursue treatment at this juncture given his age.  He declined MRI at this office visit

## 2022-02-23 NOTE — Assessment & Plan Note (Signed)
Patient is followed by vascular surgery.  Recent CT scan in July 2023.  With recommendation to repeat in 6 months.  Continue following vascular surgery as recommended

## 2022-04-07 ENCOUNTER — Other Ambulatory Visit: Payer: Self-pay

## 2022-04-07 ENCOUNTER — Telehealth: Payer: Self-pay | Admitting: Nurse Practitioner

## 2022-04-07 MED ORDER — AMLODIPINE BESYLATE 5 MG PO TABS: 5.0000 mg | ORAL_TABLET | Freq: Every day | ORAL | 3 refills | Status: DC

## 2022-04-07 MED ORDER — TAMSULOSIN HCL 0.4 MG PO CAPS: 0.4000 mg | ORAL_CAPSULE | Freq: Every day | ORAL | 1 refills | Status: DC

## 2022-04-07 NOTE — Telephone Encounter (Addendum)
amLODipine (NORVASC) 5 MG tablet  tamsulosin (FLOMAX) 0.4 MG CAPS capsule  Last visit 02/23/2022 Next Visit per [facility] come back around 08/24/2022 for CPE and labs

## 2022-04-07 NOTE — Telephone Encounter (Signed)
Rx sent to provider

## 2022-04-07 NOTE — Telephone Encounter (Signed)
Prescription Request  04/07/2022  LOV: 02/23/2022  What is the name of the medication or equipment? amLODipine (NORVASC) 5 MG tablet   tamsulosin (FLOMAX) 0.4 MG CAPS capsule   Have you contacted your pharmacy to request a refill? Yes   Which pharmacy would you like this sent to?   [facility] [place] [address] Phone: [phone] Fax: [phone]     Patient notified that their request is being sent to the clinical staff for review and that they should receive a response within 2 business days.   Please advise at [place]

## 2022-06-14 ENCOUNTER — Other Ambulatory Visit: Payer: Self-pay | Admitting: Cardiovascular Disease

## 2022-06-14 NOTE — Telephone Encounter (Signed)
Pt is scheduled on 5/17

## 2022-06-14 NOTE — Telephone Encounter (Signed)
Last visit 06/14/2021--You will need a follow up appointment in 12 months  Next visit 06/16/22

## 2022-06-14 NOTE — Telephone Encounter (Signed)
Please schedule 12 month F/U appt for 90 day refills. Thank you! 

## 2022-06-16 ENCOUNTER — Ambulatory Visit: Payer: Medicare HMO | Attending: Nurse Practitioner | Admitting: Nurse Practitioner

## 2022-06-16 ENCOUNTER — Encounter: Payer: Self-pay | Admitting: Nurse Practitioner

## 2022-06-16 VITALS — BP 123/75 | HR 70 | Ht 73.0 in | Wt 210.6 lb

## 2022-06-16 DIAGNOSIS — I10 Essential (primary) hypertension: Secondary | ICD-10-CM

## 2022-06-16 DIAGNOSIS — I2510 Atherosclerotic heart disease of native coronary artery without angina pectoris: Secondary | ICD-10-CM

## 2022-06-16 DIAGNOSIS — I714 Abdominal aortic aneurysm, without rupture, unspecified: Secondary | ICD-10-CM

## 2022-06-16 DIAGNOSIS — E785 Hyperlipidemia, unspecified: Secondary | ICD-10-CM

## 2022-06-16 DIAGNOSIS — I739 Peripheral vascular disease, unspecified: Secondary | ICD-10-CM

## 2022-06-16 NOTE — Patient Instructions (Signed)
Medication Instructions:  Your physician recommends that you continue on your current medications as directed. Please refer to the Current Medication list given to you today.  *If you need a refill on your cardiac medications before your next appointment, please call your pharmacy*  Lab Work: -None ordered If you have labs (blood work) drawn today and your tests are completely normal, you will receive your results only by: MyChart Message (if you have MyChart) OR A paper copy in the mail If you have any lab test that is abnormal or we need to change your treatment, we will call you to review the results.   Testing/Procedures: Your physician has requested that you have an abdominal aorta duplex. During this test, an ultrasound is used to evaluate the aorta. Allow 30 minutes for this exam. Do not eat after midnight the day before and avoid carbonated beverages   Follow-Up: At [facility], you and your health needs are our priority.  As part of our continuing mission to provide you with exceptional heart care, we have created designated Provider Care Teams.  These Care Teams include your primary Cardiologist (physician) and Advanced Practice Providers (APPs -  Physician Assistants and Nurse Practitioners) who all work together to provide you with the care you need, when you need it.  We recommend signing up for the patient portal called "MyChart".  Sign up information is provided on this After Visit Summary.  MyChart is used to connect with patients for Virtual Visits (Telemedicine).  Patients are able to view lab/test results, encounter notes, upcoming appointments, etc.  Non-urgent messages can be sent to your provider as well.   To learn more about what you can do with MyChart, go to [email or web address].    Your next appointment:   1 year(s)  Provider:   You may see Julien Nordmann, MD or one of the following Advanced Practice Providers on your designated Care Team:    Nicolasa Ducking, NP  Other Instructions -Ambulatory referral to Vascular Surgery

## 2022-06-16 NOTE — Progress Notes (Signed)
Office Visit    Patient Name: [name] Date of Encounter: 06/16/2022  Primary Care Provider:  Eden Emms, NP Primary Cardiologist:  Julien Nordmann, MD  Chief Complaint    82 year old male with history of CAD status post coronary artery bypass grafting, hypertension, hyperlipidemia, peripheral arterial disease, and infrarenal abdominal aortic aneurysm, who presents for follow-up related to CAD.  Past Medical History    Past Medical History:  Diagnosis Date   AAA (abdominal aortic aneurysm) (HCC)    a. 07/2021 CTA Abd/Pelvis: 4.6cm biloped infrarenal AAA.   Arthritis    CAD (coronary artery disease)    a. 2007 s/p CABG x 4 (LIMA-LAD, VG->Diag, VG->OM->LPDA); b.12/2007 MV: Attenuation artifact, low risk.   Carotid arterial disease (HCC)    a. 07/2021 U/S: 1-39% bilateral internal carotid artery stenosis.   Diverticulosis    History of kidney stones late 70's early 80's   HLD (hyperlipidemia)    HTN (hypertension)    Kidney stones    Myocardial infarction (HCC) 2008   light   PAD (peripheral artery disease) (HCC)    a. 07/2021 CTA Abd/pelvis: suspected hemodynamically significant left common femoral artery stenosis and subtotal occlusion of imaged portions of the bilateral superficial femoral arteries.   Pancreatic mass    a. 07/2021 CTA Abd/Pelvis: 1.6 cm enhancing lesion involving the caudal aspect of the mid body of the pancreas without associated downstream pancreatic atrophy or ductal dilatation seen nonspecific.   Positional vertigo    Pulmonary nodules    Renal artery stenosis (HCC)    a. 07/2021 CTA Abd: suspected hemodynamically significant R RA stenosis.   Past Surgical History:  Procedure Laterality Date   bypass surgery (otheR)  August 2007   COLONOSCOPY     CORONARY ARTERY BYPASS GRAFT     SHOULDER SURGERY     TONSILLECTOMY      Allergies  Allergies  Allergen Reactions   Codeine Nausea And Vomiting    History of Present Illness     82 year old male with history of CAD status post coronary artery bypass grafting, hypertension, hyperlipidemia, peripheral arterial disease, and infrarenal abdominal aortic aneurysm.  As noted, he underwent CABG x 4 in 2007 with a LIMA to the LAD, vein graft to the diagonal, and sequential vein graft to the obtuse marginal and LPDA.  Subsequent stress testing in 2009 was felt to be low risk.  He was last seen in cardiology clinic in May 2023, at which time he was doing well.  He has subsequently had an abdominal ultrasound which showed a 5 cm abdominal aortic aneurysm.  He was seen by vascular surgery and subsequently underwent CTA of the abdomen and pelvis which showed a 4.6 cm bilobed infrarenal abdominal aortic aneurysm.  Per vascular surgery note, plan was made for follow-up ultrasound in 6 months.  CTA was also notable for incidental findings of left renal artery stenosis, bilateral femoral artery disease, pancreatic lesion, diverticulosis, prostatomegaly, and left lower lobe pulmonary nodule as well as ill-defined groundglass opacity in the left upper lobe.  Patient opted to forego MRI to evaluate pancreatic lesion per primary care note.  Since his last visit, he has done well.  He remains active and note that his 42 y/o girlfriend keeps him on his toes.  He is very active and says he is "wide open."  He denies chest pain, dyspnea, palpitations, PND, orthopnea, dizziness, syncope, edema, or early satiety.  When questioned about his plan to follow-up abdominal aortic ultrasound  which was due several months ago, he says that he never got notification that it was scheduled but is interested in having it performed.  Home Medications    Current Outpatient Medications  Medication Sig Dispense Refill   acetaminophen (TYLENOL 8 HOUR) 650 MG CR tablet Take 1 tablet (650 mg total) by mouth every 8 (eight) hours as needed for pain or fever. 30 tablet 0   amLODipine (NORVASC) 5 MG tablet Take 1 tablet (5 mg  total) by mouth daily. 90 tablet 3   aspirin EC 81 MG tablet Take 1 tablet (81 mg total) by mouth daily.     Bilberry 1000 MG CAPS Take 1 capsule by mouth daily.     ibuprofen (ADVIL,MOTRIN) 200 MG tablet Take 200 mg by mouth every 6 (six) hours as needed for headache or moderate pain.      lisinopril (ZESTRIL) 20 MG tablet TAKE 1 TABLET EVERY DAY 90 tablet 0   metoprolol tartrate (LOPRESSOR) 25 MG tablet TAKE 1 TABLET TWICE DAILY 180 tablet 0   Multiple Vitamins-Minerals (ONE-A-DAY EXTRAS ANTIOXIDANT) CAPS Take by mouth daily.       naproxen (NAPROSYN) 375 MG tablet Take 1 tablet (375 mg total) by mouth 2 (two) times daily. 10 tablet 0   neomycin-bacitracin-polymyxin (NEOSPORIN) ointment Apply 1 application topically every 12 (twelve) hours. 15 g 0   simvastatin (ZOCOR) 40 MG tablet Take 1 tablet (40 mg total) by mouth daily at 6 PM. 90 tablet 2   tamsulosin (FLOMAX) 0.4 MG CAPS capsule Take 1 capsule (0.4 mg total) by mouth daily. 90 capsule 1   vitamin E 180 MG (400 UNITS) capsule Take 400 Units by mouth daily.     No current facility-administered medications for this visit.     Review of Systems    He denies chest pain, palpitations, dyspnea, pnd, orthopnea, n, v, dizziness, syncope, edema, weight gain, or early satiety.  All other systems reviewed and are otherwise negative except as noted above.    Physical Exam    VS:  BP 123/75 (BP Location: Left Arm, Patient Position: Sitting, Cuff Size: Normal)   Pulse 70   Ht 6\' 1"  (1.854 m)   Wt 210 lb 9.6 oz (95.5 kg)   SpO2 95%   BMI 27.79 kg/m  , BMI Body mass index is 27.79 kg/m.     GEN: Well nourished, well developed, in no acute distress. HEENT: normal. Neck: Supple, no JVD, carotid bruits, or masses. Cardiac: RRR, 2/6 syst murmur heard throughout - loudest @ the RUSB.  No rubs or gallops. No clubbing, cyanosis, edema.  Radials 2+/PT 2+ and equal bilaterally.  Respiratory:  Respirations regular and unlabored, clear to  auscultation bilaterally. GI: Soft, nontender, nondistended, BS + x 4. MS: no deformity or atrophy. Skin: warm and dry, no rash. Neuro:  Strength and sensation are intact. Psych: Normal affect.  Accessory Clinical Findings    ECG personally reviewed by me today -regular sinus rhythm, 70, first-degree AV block, rightward axis- no acute changes.  Lab Results  Component Value Date   WBC 8.0 02/23/2022   HGB 14.9 02/23/2022   HCT 44.4 02/23/2022   MCV 97.6 02/23/2022   PLT 268.0 02/23/2022   Lab Results  Component Value Date   CREATININE 1.12 02/23/2022   BUN 20 02/23/2022   NA 140 02/23/2022   K 4.7 02/23/2022   CL 105 02/23/2022   CO2 26 02/23/2022   Lab Results  Component Value Date   ALT 17  02/23/2022   AST 21 02/23/2022   ALKPHOS 64 02/23/2022   BILITOT 0.7 02/23/2022   Lab Results  Component Value Date   CHOL 110 02/23/2022   HDL 35.70 (L) 02/23/2022   LDLCALC 58 02/23/2022   TRIG 85.0 02/23/2022   CHOLHDL 3 02/23/2022    Assessment & Plan    1.  Coronary artery disease: Status post CABG x 4 in 2007 with low risk Myoview in 2009.  He remains active without chest pain or dyspnea.  ECG, heart rate, and blood pressure stable.  Continue aspirin, beta-blocker, ACE inhibitor, statin, and calcium channel blocker therapy.  2.  Essential hypertension: Blood pressure is controlled today.  Continue beta-blocker, ACE inhibitor, and calcium channel blocker therapy.  3.  Hyperlipidemia: LDL at goal at 58 in January with normal LFTs.  Continue statin therapy.  4.  Infrarenal abdominal aortic aneurysm: 4.6 cm by CTA last year with plan for follow-up ultrasound 6 months later though, this was never performed.  Patient wishes to proceed and I will arrange for this to be performed as well as referral back to vascular surgery for ongoing follow-up, as he does not currently have any pending appointments.  Blood pressure well-controlled today on current medications.  5.  Peripheral  arterial disease: Suggestive of renal artery stenosis and bilateral lower extremity arterial disease on CTA last year.  Patient denies claudication.  Blood pressure is well-controlled with normal creatinine in January.  Continue secondary prevention with aspirin and statin therapy.  6.  Disposition: Arrange for aortic duplex to evaluate AAA and ongoing vascular surgery follow-up.  Follow-up here in 1 year or sooner if necessary.  Nicolasa Ducking, NP 06/16/2022, 11:35 AM

## 2022-06-20 ENCOUNTER — Telehealth: Payer: Self-pay | Admitting: Cardiovascular Disease

## 2022-06-20 ENCOUNTER — Telehealth: Payer: Self-pay | Admitting: Nurse Practitioner

## 2022-06-20 NOTE — Telephone Encounter (Signed)
*  STAT* If patient is at the pharmacy, call can be transferred to refill team.   1. Which medications need to be refilled? (please list name of each medication and dose if known) amLODipine (NORVASC) 5 MG tablet ; lisinopril (ZESTRIL) 20 MG tablet ; metoprolol tartrate (LOPRESSOR) 25 MG tablet ; simvastatin (ZOCOR) 40 MG tablet ; tamsulosin (FLOMAX) 0.4 MG CAPS capsule   2. Which pharmacy/location (including street and city if local pharmacy) is medication to be sent to? [id number] [address], [place] [phone] [address]   3. Do they need a 30 day or 90 day supply? 90

## 2022-06-20 NOTE — Telephone Encounter (Signed)
The medications prescribed by Dr. Mariah Milling were sent to [facility] on 06/14/22.    Per patient he has been in contact with [facility] and they advised patient that he will receive his meds within 48 hrs.    Patient states he does not need a refill on Simvastatin and has enough cardiac meds to last 2 weeks.    Tamsulosin is prescribed by PCP and pt has been in contact with that office regarding refills

## 2022-06-20 NOTE — Telephone Encounter (Signed)
Prescription Request  06/20/2022  LOV: 02/23/2022  What is the name of the medication or equipment? tamsulosin (FLOMAX) 0.4 MG CAPS capsule   Have you contacted your pharmacy to request a refill? No   Which pharmacy would you like this se [id number] [address] [place] [address] Phone: [phone] Fax: [phone]    Patient notified that their request is being sent to the clinical staff for review and that they should receive a response within 2 business days.   Please advise at Mobile [phone]   Pt stated he is all out of his medication

## 2022-06-20 NOTE — Telephone Encounter (Signed)
Sent in 6 months supply to mail order pharmacy. Can we verify that he is not using it. If not can we remove it and I can send to local pharmacy

## 2022-06-21 NOTE — Telephone Encounter (Signed)
Left message to return call to our office.  

## 2022-06-22 NOTE — Telephone Encounter (Signed)
Received a call back regarding patient, states they received some prescriptions in the mail from [facility], they are listed below.  Amlodipine  Lisinopril Simvastatin Metoprolol tamsulosin

## 2022-06-22 NOTE — Telephone Encounter (Signed)
Called pt and he stated that he received everything he needed.

## 2022-06-22 NOTE — Telephone Encounter (Signed)
Called and spoke to patient. He is using [facility].he spoke to someone at [facility] today and told him should be delivered by 2pm today. He is not at home. Will give our office a call once he gets home and let us know if he has received.

## 2022-07-31 ENCOUNTER — Telehealth: Payer: Self-pay | Admitting: Nurse Practitioner

## 2022-07-31 NOTE — Telephone Encounter (Signed)
Patient came to the office to inform us that he has been taking only 1 tamsulosin (FLOMAX) 0.4 MG CAPS capsule a day instead of 2. Figures that someone would like to update his prescription

## 2022-08-07 NOTE — Telephone Encounter (Signed)
noted 

## 2022-08-09 ENCOUNTER — Ambulatory Visit: Payer: Medicare HMO | Attending: Nurse Practitioner

## 2022-08-09 DIAGNOSIS — I714 Abdominal aortic aneurysm, without rupture, unspecified: Secondary | ICD-10-CM

## 2022-08-09 DIAGNOSIS — I7143 Infrarenal abdominal aortic aneurysm, without rupture: Secondary | ICD-10-CM

## 2022-08-11 ENCOUNTER — Telehealth: Payer: Self-pay

## 2022-08-11 ENCOUNTER — Other Ambulatory Visit: Payer: Self-pay | Admitting: Primary Care

## 2022-08-11 DIAGNOSIS — N400 Enlarged prostate without lower urinary tract symptoms: Secondary | ICD-10-CM

## 2022-08-11 MED ORDER — TAMSULOSIN HCL 0.4 MG PO CAPS
0.8000 mg | ORAL_CAPSULE | Freq: Every day | ORAL | 0 refills | Status: DC
Start: 2022-08-11 — End: 2022-08-14

## 2022-08-11 NOTE — Telephone Encounter (Signed)
Called patient let him know we called in 30 day and will address with pcp for mail order script

## 2022-08-11 NOTE — Telephone Encounter (Signed)
Patient came in office on 7/1 to let us know that he is taking 2 tab of Flomax a day. Message was taken in error and sent as taking one a day. His script was given for one a day. Patient took last pill today. Would like to have a 30 refill sent to [facility] that I have listed in pharmacy. And a long term sent to mail order.   Patient would like call when taken care of today.

## 2022-08-11 NOTE — Telephone Encounter (Signed)
Noted.  Tamsulosin prescription updated to 0.8 mg (2 capsules) daily for urine flow.  This was sent to his local [facility] pharmacy.  I will forward to PCP who can prescribe his long-term order through mail order pharmacy.

## 2022-08-14 ENCOUNTER — Encounter: Payer: Self-pay | Admitting: Surgery

## 2022-08-14 ENCOUNTER — Ambulatory Visit: Payer: Medicare HMO | Admitting: Surgery

## 2022-08-14 VITALS — BP 146/72 | HR 57 | Temp 98.0°F | Resp 20 | Ht 73.0 in | Wt 210.9 lb

## 2022-08-14 DIAGNOSIS — I7143 Infrarenal abdominal aortic aneurysm, without rupture: Secondary | ICD-10-CM | POA: Diagnosis not present

## 2022-08-14 MED ORDER — TAMSULOSIN HCL 0.4 MG PO CAPS
0.8000 mg | ORAL_CAPSULE | Freq: Every day | ORAL | 1 refills | Status: DC
Start: 2022-08-14 — End: 2022-09-18

## 2022-08-14 NOTE — Telephone Encounter (Signed)
Script sent in to his mail order pharmacy

## 2022-08-14 NOTE — Progress Notes (Signed)
[facility]  Patient name: [name] MRN: [id number] DOB: 1940/08/11 Sex: male   REASON FOR VISIT:    Follow up  HISOTRY OF PRESENT ILLNESS:    [name] is a 82 y.o. male who returns today for follow-up of an abdominal aortic aneurysm that was initially detected in 2010, measuring 3.1 cm.  He last had a CT scan in 2023 that showed a maximum diameter of 4.2 cm.  He denies any abdominal pain or back pain.  He denies any claudication symptoms or strokelike symptoms.  The patient has a history of coronary artery disease.  He is status post CABG in 2007.  He is a former smoker.  He is on a statin for hypercholesterolemia.  He is medically managed for hypertension.  PAST MEDICAL HISTORY:   Past Medical History:  Diagnosis Date   AAA (abdominal aortic aneurysm) (HCC)    a. 07/2021 CTA Abd/Pelvis: 4.6cm biloped infrarenal AAA.   Arthritis    CAD (coronary artery disease)    a. 2007 s/p CABG x 4 (LIMA-LAD, VG->Diag, VG->OM->LPDA); b.12/2007 MV: Attenuation artifact, low risk.   Carotid arterial disease (HCC)    a. 07/2021 U/S: 1-39% bilateral internal carotid artery stenosis.   Diverticulosis    History of kidney stones late 70's early 80's   HLD (hyperlipidemia)    HTN (hypertension)    Kidney stones    Myocardial infarction (HCC) 2008   light   PAD (peripheral artery disease) (HCC)    a. 07/2021 CTA Abd/pelvis: suspected hemodynamically significant left common femoral artery stenosis and subtotal occlusion of imaged portions of the bilateral superficial femoral arteries.   Pancreatic mass    a. 07/2021 CTA Abd/Pelvis: 1.6 cm enhancing lesion involving the caudal aspect of the mid body of the pancreas without associated downstream pancreatic atrophy or ductal dilatation seen nonspecific.   Positional vertigo    Pulmonary nodules    Renal artery stenosis (HCC)    a. 07/2021 CTA Abd: suspected hemodynamically  significant R RA stenosis.     FAMILY HISTORY:   Family History  Problem Relation Age of Onset   Alzheimer's disease Father    Colon cancer Father    Prostate cancer Father    Diabetes Sister    Prostate cancer Brother    Diabetes Mother     SOCIAL HISTORY:   Social History   Tobacco Use   Smoking status: Former    Types: Cigarettes   Smokeless tobacco: Never   Tobacco comments:    Quit in 37  Substance Use Topics   Alcohol use: No     ALLERGIES:   Allergies  Allergen Reactions   Codeine Nausea And Vomiting     CURRENT MEDICATIONS:   Current Outpatient Medications  Medication Sig Dispense Refill   acetaminophen (TYLENOL 8 HOUR) 650 MG CR tablet Take 1 tablet (650 mg total) by mouth every 8 (eight) hours as needed for pain or fever. 30 tablet 0   amLODipine (NORVASC) 5 MG tablet Take 1 tablet (5 mg total) by mouth daily. 90 tablet 3   aspirin EC 81 MG tablet Take 1 tablet (81 mg total) by mouth daily.     Bilberry 1000 MG CAPS Take 1 capsule by mouth daily.     ibuprofen (ADVIL,MOTRIN) 200 MG tablet Take 200 mg by mouth every 6 (six) hours as needed for headache or moderate pain.      lisinopril (ZESTRIL) 20 MG tablet TAKE 1 TABLET  EVERY DAY 90 tablet 0   metoprolol tartrate (LOPRESSOR) 25 MG tablet TAKE 1 TABLET TWICE DAILY 180 tablet 0   Multiple Vitamins-Minerals (ONE-A-DAY EXTRAS ANTIOXIDANT) CAPS Take by mouth daily.       naproxen (NAPROSYN) 375 MG tablet Take 1 tablet (375 mg total) by mouth 2 (two) times daily. 10 tablet 0   neomycin-bacitracin-polymyxin (NEOSPORIN) ointment Apply 1 application topically every 12 (twelve) hours. 15 g 0   simvastatin (ZOCOR) 40 MG tablet Take 1 tablet (40 mg total) by mouth daily at 6 PM. 90 tablet 2   tamsulosin (FLOMAX) 0.4 MG CAPS capsule Take 2 capsules (0.8 mg total) by mouth daily. 180 capsule 1   vitamin E 180 MG (400 UNITS) capsule Take 400 Units by mouth daily.     No current facility-administered medications  for this visit.    REVIEW OF SYSTEMS:   [X]  denotes positive finding, [ ]  denotes negative finding Cardiac  Comments:  Chest pain or chest pressure:    Shortness of breath upon exertion:    Short of breath when lying flat:    Irregular heart rhythm:        Vascular    Pain in calf, thigh, or hip brought on by ambulation:    Pain in feet at night that wakes you up from your sleep:     Blood clot in your veins:    Leg swelling:         Pulmonary    Oxygen at home:    Productive cough:     Wheezing:         Neurologic    Sudden weakness in arms or legs:     Sudden numbness in arms or legs:     Sudden onset of difficulty speaking or slurred speech:    Temporary loss of vision in one eye:     Problems with dizziness:         Gastrointestinal    Blood in stool:     Vomited blood:         Genitourinary    Burning when urinating:     Blood in urine:        Psychiatric    Major depression:         Hematologic    Bleeding problems:    Problems with blood clotting too easily:        Skin    Rashes or ulcers:        Constitutional    Fever or chills:      PHYSICAL EXAM:   Vitals:   08/14/22 0911  BP: (!) 146/72  Pulse: (!) 57  Resp: 20  Temp: 98 F (36.7 C)  SpO2: 93%  Weight: 210 lb 14.4 oz (95.7 kg)  Height: 6\' 1"  (1.854 m)    GENERAL: The patient is a well-nourished male, in no acute distress. The vital signs are documented above. CARDIAC: There is a regular rate and rhythm.  VASCULAR: I could not palpate pedal pulses.  No carotid bruits PULMONARY: Non-labored respirations ABDOMEN: Soft and non-tender  MUSCULOSKELETAL: There are no major deformities or cyanosis. NEUROLOGIC: No focal weakness or paresthesias are detected. SKIN: There are no ulcers or rashes noted. PSYCHIATRIC: The patient has a normal affect.  STUDIES:   I have reviewed the following duplex: Abdominal Aorta: There is evidence of abnormal dilatation of the mid and  distal Abdominal  aorta. The largest aortic measurement is 4.4 cm.      MEDICAL ISSUES:  AAA: Maximum aortic diameter is 4.4 cm.  We discussed recommending surgical repair once the diameter is greater than 5 cm.  He will return in 6 months for surveillance ultrasound imaging.    Charlena Cross, MD, FACS [facility] [phone] Pager [phone]

## 2022-08-15 ENCOUNTER — Encounter: Payer: Self-pay | Admitting: Nurse Practitioner

## 2022-08-15 ENCOUNTER — Ambulatory Visit (INDEPENDENT_AMBULATORY_CARE_PROVIDER_SITE_OTHER): Payer: Medicare HMO | Admitting: Nurse Practitioner

## 2022-08-15 VITALS — BP 134/72 | HR 63 | Temp 97.8°F | Ht 73.0 in | Wt 210.0 lb

## 2022-08-15 DIAGNOSIS — I714 Abdominal aortic aneurysm, without rupture, unspecified: Secondary | ICD-10-CM | POA: Diagnosis not present

## 2022-08-15 DIAGNOSIS — Z125 Encounter for screening for malignant neoplasm of prostate: Secondary | ICD-10-CM

## 2022-08-15 DIAGNOSIS — E785 Hyperlipidemia, unspecified: Secondary | ICD-10-CM | POA: Diagnosis not present

## 2022-08-15 DIAGNOSIS — N400 Enlarged prostate without lower urinary tract symptoms: Secondary | ICD-10-CM

## 2022-08-15 DIAGNOSIS — R911 Solitary pulmonary nodule: Secondary | ICD-10-CM

## 2022-08-15 DIAGNOSIS — Z0000 Encounter for general adult medical examination without abnormal findings: Secondary | ICD-10-CM | POA: Diagnosis not present

## 2022-08-15 DIAGNOSIS — I10 Essential (primary) hypertension: Secondary | ICD-10-CM

## 2022-08-15 DIAGNOSIS — K439 Ventral hernia without obstruction or gangrene: Secondary | ICD-10-CM

## 2022-08-15 DIAGNOSIS — K8689 Other specified diseases of pancreas: Secondary | ICD-10-CM

## 2022-08-15 LAB — COMPREHENSIVE METABOLIC PANEL
ALT: 18 U/L (ref 0–53)
AST: 17 U/L (ref 0–37)
Albumin: 4.1 g/dL (ref 3.5–5.2)
Alkaline Phosphatase: 69 U/L (ref 39–117)
BUN: 24 mg/dL — ABNORMAL HIGH (ref 6–23)
CO2: 26 mEq/L (ref 19–32)
Calcium: 8.7 mg/dL (ref 8.4–10.5)
Chloride: 108 mEq/L (ref 96–112)
Creatinine, Ser: 1.16 mg/dL (ref 0.40–1.50)
GFR: 58.12 mL/min — ABNORMAL LOW (ref >60.00)
Glucose, Bld: 101 mg/dL — ABNORMAL HIGH (ref 70–99)
Potassium: 4.7 mEq/L (ref 3.5–5.1)
Sodium: 139 mEq/L (ref 135–145)
Total Bilirubin: 0.8 mg/dL (ref 0.2–1.2)
Total Protein: 6.4 g/dL (ref 6.0–8.3)

## 2022-08-15 LAB — CBC
HCT: 46.1 % (ref 39.0–52.0)
Hemoglobin: 14.9 g/dL (ref 13.0–17.0)
MCHC: 32.4 g/dL (ref 30.0–36.0)
MCV: 98.2 fl (ref 78.0–100.0)
Platelets: 267 10*3/uL (ref 150.0–400.0)
RBC: 4.69 Mil/uL (ref 4.22–5.81)
RDW: 15 % (ref 11.5–15.5)
WBC: 7.9 10*3/uL (ref 4.0–10.5)

## 2022-08-15 LAB — LIPID PANEL
Cholesterol: 105 mg/dL (ref 0–200)
HDL: 34.5 mg/dL — ABNORMAL LOW (ref >39.00)
LDL Cholesterol: 57 mg/dL (ref 0–99)
NonHDL: 70.25
Total CHOL/HDL Ratio: 3
Triglycerides: 65 mg/dL (ref 0.0–149.0)
VLDL: 13 mg/dL (ref 0.0–40.0)

## 2022-08-15 LAB — PSA, MEDICARE: PSA: 7.67 ng/ml — ABNORMAL HIGH (ref 0.10–4.00)

## 2022-08-15 NOTE — Assessment & Plan Note (Signed)
Discussed age-appropriate immunizations and screening exams.  Patient is up-to-date with all vaccinations he does need to get his second shingles vaccine at a local pharmacy.  Patient opts out of CRC screening will do a PSA screening today patient does have a history of BPH demonstrated prostate megaly on CT scan.  Patient was given information at discharge about preventative healthcare maintenance with anticipatory guidance.

## 2022-08-15 NOTE — Progress Notes (Signed)
Established Patient Office Visit  Subjective   Patient ID: [name], male    DOB: [birth year]  Age: 82 y.o. MRN: [id number]  Chief Complaint  Patient presents with   Annual Exam    CPE/labs. Fasting.     HPI  HTN: currently on amlodipine, lisinopril, and metoprolol  CAD: hx of cabg. patient is followed by cardiology  HLD: on simvastatin and followed by cardiology  for complete physical and follow up of chronic conditions.  Immunizations: -Tetanus: Completed in 2020 -Influenza: out of season  -Shingles: Completed Shingrix series -Pneumonia: Completed in the past  Diet: Fair diet. States that he does not eat a lot. He will have at least 1-2 meals a day. He will drink a little water and a "red" drink/juice  Exercise: No regular exercise.  Eye exam: Completes annually. Has implants. States that he needs updating   Dental exam: dentures     Colonoscopy: aged out Lung Cancer Screening: NA   PSA: Prostate megaly noted on CT scan        Review of Systems  Constitutional:  Negative for chills and fever.  Respiratory:  Negative for shortness of breath.   Cardiovascular:  Negative for chest pain and leg swelling.  Gastrointestinal:  Negative for abdominal pain, blood in stool, constipation, diarrhea, nausea and vomiting.       BM daily   Genitourinary:  Negative for dysuria and hematuria.       Nocturia x 2-3  Neurological:  Negative for tingling and headaches.  Psychiatric/Behavioral:  Negative for hallucinations and suicidal ideas.       Objective:     BP 134/72   Pulse 63   Temp 97.8 F (36.6 C)   Ht 6\' 1"  (1.854 m)   Wt 210 lb (95.3 kg)   SpO2 93%   BMI 27.71 kg/m  BP Readings from Last 3 Encounters:  08/15/22 134/72  08/14/22 (!) 146/72  06/16/22 123/75   Wt Readings from Last 3 Encounters:  08/15/22 210 lb (95.3 kg)  08/14/22 210 lb 14.4 oz (95.7 kg)  06/16/22 210 lb 9.6 oz (95.5 kg)      Physical Exam Vitals and nursing note  reviewed.  Constitutional:      Appearance: Normal appearance.  HENT:     Right Ear: Tympanic membrane, ear canal and external ear normal.     Left Ear: Tympanic membrane, ear canal and external ear normal.     Mouth/Throat:     Mouth: Mucous membranes are moist.     Pharynx: Oropharynx is clear.  Eyes:     Extraocular Movements: Extraocular movements intact.     Pupils: Pupils are equal, round, and reactive to light.  Cardiovascular:     Rate and Rhythm: Normal rate and regular rhythm.     Pulses: Normal pulses.     Heart sounds: Normal heart sounds.  Pulmonary:     Effort: Pulmonary effort is normal.     Breath sounds: Normal breath sounds.  Abdominal:     General: Bowel sounds are normal. There is no distension.     Palpations: There is no mass.     Tenderness: There is no abdominal tenderness.     Hernia: A hernia is present.  Musculoskeletal:     Right lower leg: Edema present.     Left lower leg: Edema present.  Lymphadenopathy:     Cervical: No cervical adenopathy.  Skin:    General: Skin is warm.  Neurological:  General: No focal deficit present.     Mental Status: He is alert.     Deep Tendon Reflexes:     Reflex Scores:      Bicep reflexes are 2+ on the right side and 2+ on the left side.      Patellar reflexes are 2+ on the right side and 2+ on the left side.    Comments: Bilateral upper and lower extremity strength 5/5  Psychiatric:        Mood and Affect: Mood normal.        Behavior: Behavior normal.        Thought Content: Thought content normal.        Judgment: Judgment normal.      No results found for any visits on 08/15/22.    The ASCVD Risk score (Arnett DK, et al., 2019) failed to calculate for the following reasons:   The 2019 ASCVD risk score is only valid for ages 58 to 10    Assessment & Plan:   Problem List Items Addressed This Visit       Cardiovascular and Mediastinum   HYPERTENSION, BENIGN    Patient currently maintained  on metoprolol, amlodipine and lisinopril.  Continue taking medication as prescribed.  Continue following with cardiology as recommended.  Patient's blood pressure within normal limits today      Relevant Orders   CBC   Comprehensive metabolic panel   Abdominal aortic aneurysm (AAA) without rupture [facility])    Patient is followed by vascular and was recently seen by them.  Did review last angio scan done        Other   Hyperlipidemia    Pending lipid panel today continue taking simvastatin      Relevant Orders   Lipid panel   Pancreatic mass    Did review this with patient again today and he is interested in pursuing an MRI order placed      Relevant Orders   MR Abdomen W Wo Contrast   Enlarged prostate    Noticed on CT scan pending PSA continue tamsulosin 0.8 mg      Preventative health care - Primary    Discussed age-appropriate immunizations and screening exams.  Patient is up-to-date with all vaccinations he does need to get his second shingles vaccine at a local pharmacy.  Patient opts out of CRC screening will do a PSA screening today patient does have a history of BPH demonstrated prostate megaly on CT scan.  Patient was given information at discharge about preventative healthcare maintenance with anticipatory guidance.      Relevant Orders   CBC   Comprehensive metabolic panel   Other Visit Diagnoses     Screening for prostate cancer       Relevant Orders   PSA, Medicare   Ventral hernia without obstruction or gangrene       Solitary pulmonary nodule       Relevant Orders   CT Chest Wo Contrast       Return in about 6 months (around 02/15/2023) for BP recheck.    Audria Nine, NP

## 2022-08-15 NOTE — Assessment & Plan Note (Signed)
Noticed on CT scan pending PSA continue tamsulosin 0.8 mg

## 2022-08-15 NOTE — Assessment & Plan Note (Signed)
Patient is followed by vascular and was recently seen by them.  Did review last angio scan done

## 2022-08-15 NOTE — Assessment & Plan Note (Signed)
Patient currently maintained on metoprolol, amlodipine and lisinopril.  Continue taking medication as prescribed.  Continue following with cardiology as recommended.  Patient's blood pressure within normal limits today

## 2022-08-15 NOTE — Assessment & Plan Note (Signed)
Pending lipid panel today continue taking simvastatin

## 2022-08-15 NOTE — Assessment & Plan Note (Signed)
Did review this with patient again today and he is interested in pursuing an MRI order placed

## 2022-08-15 NOTE — Patient Instructions (Signed)
Nice to see you today I will see you in 6 months for a check up, sooner if you need me  I will be in touch with the labs once I have them   Get your second shingles vaccine at the pharmacy

## 2022-08-23 ENCOUNTER — Other Ambulatory Visit: Payer: Self-pay

## 2022-08-23 DIAGNOSIS — I7143 Infrarenal abdominal aortic aneurysm, without rupture: Secondary | ICD-10-CM

## 2022-08-25 ENCOUNTER — Telehealth: Payer: Self-pay | Admitting: Nurse Practitioner

## 2022-08-25 DIAGNOSIS — R972 Elevated prostate specific antigen [PSA]: Secondary | ICD-10-CM

## 2022-08-25 NOTE — Telephone Encounter (Signed)
-----   Message from [facility] T sent at 08/25/2022 11:29 AM EDT ----- Called patient reviewed all information and repeated back to me. Will call if any questions.  Patient is ok with referral would like to be sent to [place]. Will let office know if he has not received call in 2 weeks for appointment.

## 2022-08-25 NOTE — Telephone Encounter (Signed)
Referral sent 

## 2022-09-08 ENCOUNTER — Ambulatory Visit: Admission: RE | Admit: 2022-09-08 | Payer: Medicare HMO | Source: Ambulatory Visit

## 2022-09-08 ENCOUNTER — Telehealth: Payer: Self-pay | Admitting: Nurse Practitioner

## 2022-09-08 ENCOUNTER — Ambulatory Visit: Payer: Medicare HMO | Attending: Nurse Practitioner

## 2022-09-08 NOTE — Telephone Encounter (Signed)
To change the location are yall able to change that or do I need to place all new orders?

## 2022-09-08 NOTE — Telephone Encounter (Signed)
Patient came in and stated that he went to [facility] for his MRI and CT scans. He stated that when he got there they said they didn't know where he was suppose to go and he had went upstairs and downstairs trying to find the correct area. He would like for these scans to do rescheduled but in [facility]. He stated that he is familiar with that hospital. Thank you!

## 2022-09-16 ENCOUNTER — Other Ambulatory Visit: Payer: Self-pay | Admitting: Primary Care

## 2022-09-16 DIAGNOSIS — N400 Enlarged prostate without lower urinary tract symptoms: Secondary | ICD-10-CM

## 2022-09-18 NOTE — Telephone Encounter (Signed)
Pt is aware via detailed voicemail to contact them to schedule his CT and MRI  [facility] [address] [place], [place] [address] [place], [address], [place] Phone: [phone]

## 2022-09-18 NOTE — Telephone Encounter (Signed)
Authorization updated to reflect location change from [facility] to [facility]  Authorization #[id number]  Tracking #[id number] Performing facility: [facility]  NPI - [id number] Dates of service: 09/08/2022 - 11/07/2022

## 2022-09-18 NOTE — Addendum Note (Signed)
Addended by: Eden Emms on: 09/18/2022 03:51 PM   Modules accepted: Orders

## 2022-09-18 NOTE — Telephone Encounter (Signed)
Orders have been changed

## 2022-09-28 ENCOUNTER — Other Ambulatory Visit: Payer: Self-pay | Admitting: Cardiovascular Disease

## 2022-10-05 ENCOUNTER — Telehealth: Payer: Self-pay | Admitting: Nurse Practitioner

## 2022-10-05 ENCOUNTER — Telehealth: Payer: Self-pay

## 2022-10-05 NOTE — Telephone Encounter (Signed)
Patient came in and is having problems with [facility] and his med refills. He states he has called three times and they are telling him he needs to see his provider. I see in past he was seen back in July of this year. Please call patient for further information. He would like nurse to call [facility] and get the situation fixed. Please advise

## 2022-10-05 NOTE — Telephone Encounter (Signed)
Contacted pharmacy to receive more information on why pt is not getting the medication refills sent to him. Pharmacy states that he has two refills ordered and is being delivered today.  Pharmacy states that Amlodipine and Lisinopril are being sent out today. Flomax is not due until 9/22. Pt also sent in refill for Simvastatin, however this prescription is not filled by PCP.

## 2022-10-05 NOTE — Telephone Encounter (Signed)
 Left detailed voicemail for patient to call the office back.

## 2022-10-06 ENCOUNTER — Ambulatory Visit
Admission: RE | Admit: 2022-10-06 | Discharge: 2022-10-06 | Disposition: A | Discharge status: Home / Self Care | Payer: Medicare HMO | Source: Ambulatory Visit | Attending: Nurse Practitioner | Admitting: Nurse Practitioner

## 2022-10-06 ENCOUNTER — Telehealth: Payer: Self-pay | Admitting: Cardiovascular Disease

## 2022-10-06 DIAGNOSIS — R911 Solitary pulmonary nodule: Secondary | ICD-10-CM

## 2022-10-06 MED ORDER — SIMVASTATIN 40 MG PO TABS: 40.0000 mg | ORAL_TABLET | Freq: Every day | ORAL | 2 refills | Status: DC

## 2022-10-06 MED ORDER — METOPROLOL TARTRATE 25 MG PO TABS: 25.0000 mg | ORAL_TABLET | Freq: Two times a day (BID) | ORAL | 2 refills | Status: DC

## 2022-10-06 NOTE — Telephone Encounter (Signed)
*  STAT* If patient is at the pharmacy, call can be transferred to refill team.   1. Which medications need to be refilled? (please list name of each medication and dose if known) metoprolol tartrate (LOPRESSOR) 25 MG tablet   simvastatin (ZOCOR) 40 MG tablet   tamsulosin (FLOMAX) 0.4 MG CAPS capsule   2. Which pharmacy/location (including street and city if local pharmacy) is medication to be sent to? [facility] - [place], [place] - [address]   3. Do they need a 30 day or 90 day supply? 90  Patient is almost out of medication

## 2022-10-06 NOTE — Telephone Encounter (Signed)
Left message to return call to our office.  

## 2022-10-06 NOTE — Telephone Encounter (Signed)
Pt's medication was sent to pt's pharmacy as requested. Confirmation received.  °

## 2022-10-09 NOTE — Telephone Encounter (Signed)
Called patient and emergency contact left message at both numbers to call office. Ok to mail letter to call office.

## 2022-10-09 NOTE — Telephone Encounter (Signed)
Ok to mail letter.

## 2022-10-10 NOTE — Telephone Encounter (Signed)
Letter mailed as requested

## 2022-10-23 ENCOUNTER — Telehealth: Payer: Self-pay | Admitting: Nurse Practitioner

## 2022-10-23 DIAGNOSIS — E041 Nontoxic single thyroid nodule: Secondary | ICD-10-CM

## 2022-10-23 NOTE — Telephone Encounter (Signed)
I will place the order he will have to call and see if they can do it at the same day

## 2022-10-23 NOTE — Telephone Encounter (Signed)
Contacted pt and informed him that he needs to call [place] [facility] to see if they can fit Ultrasound of thyroid on same day as MRI of abdomen. Pt verbalized and repeated understanding.

## 2022-10-23 NOTE — Telephone Encounter (Signed)
-----   Message from [facility] T sent at 10/23/2022  4:18 PM EDT ----- Called patient reviewed all information and repeated back to me. Pt stated that he has an MRI appointment that is already set up for 9/27. States that he would like ultrasound done at that time and at [facility].

## 2022-10-27 ENCOUNTER — Inpatient Hospital Stay: Admission: RE | Admit: 2022-10-27 | Payer: Medicare HMO | Source: Ambulatory Visit

## 2022-10-27 ENCOUNTER — Other Ambulatory Visit: Payer: Medicare HMO

## 2022-11-01 ENCOUNTER — Ambulatory Visit
Admission: RE | Admit: 2022-11-01 | Discharge: 2022-11-01 | Disposition: A | Discharge status: Home / Self Care | Payer: Medicare HMO | Source: Ambulatory Visit | Attending: Nurse Practitioner | Admitting: Nurse Practitioner

## 2022-11-01 DIAGNOSIS — E041 Nontoxic single thyroid nodule: Secondary | ICD-10-CM

## 2022-11-14 ENCOUNTER — Other Ambulatory Visit: Payer: Self-pay | Admitting: Surgery

## 2022-11-14 DIAGNOSIS — I739 Peripheral vascular disease, unspecified: Secondary | ICD-10-CM

## 2022-11-20 ENCOUNTER — Encounter: Payer: Self-pay | Admitting: Nurse Practitioner

## 2022-11-29 NOTE — Telephone Encounter (Signed)
Attempted to cal pt back. No answer and unable to leave VM

## 2023-01-07 ENCOUNTER — Other Ambulatory Visit: Payer: Self-pay | Admitting: Nurse Practitioner

## 2023-01-07 DIAGNOSIS — N400 Enlarged prostate without lower urinary tract symptoms: Secondary | ICD-10-CM

## 2023-01-08 NOTE — Telephone Encounter (Signed)
 Can we get patient scheduled per my last office note please

## 2023-01-08 NOTE — Telephone Encounter (Signed)
Attempted to reach patient,VM not set up to leave voice message

## 2023-01-28 IMAGING — US US AORTA
1 series · 14 of 19 positions shown · non-contrast
Comparison: CT renal stone 10/20/2017. ultrasound aorta 07/03/2008.

CLINICAL DATA: AAA screening.

EXAM:
ULTRASOUND OF ABDOMINAL AORTA
TECHNIQUE: Ultrasound examination of the abdominal aorta and proximal common
iliac arteries was performed to evaluate for aneurysm. Additional
color and Doppler images of the distal aorta were obtained to
document patency.

[Series 1: us aorta · 0.25mm/px · 14 of 19 slices shown]
[im 1/19]
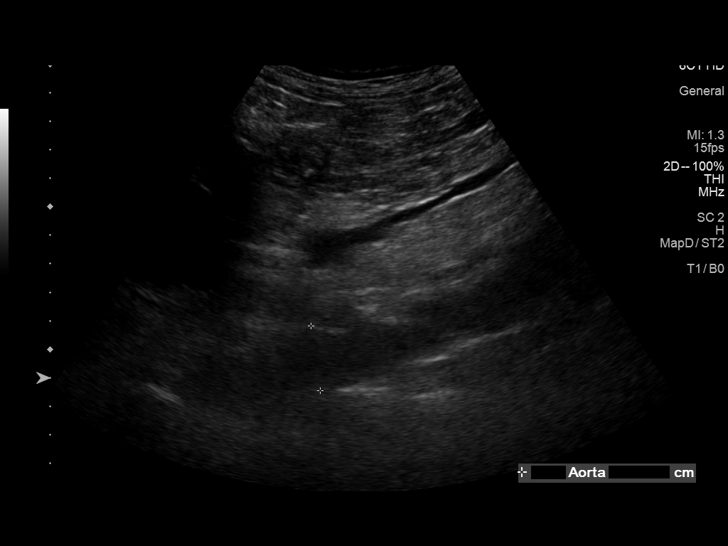
[im 3/19]
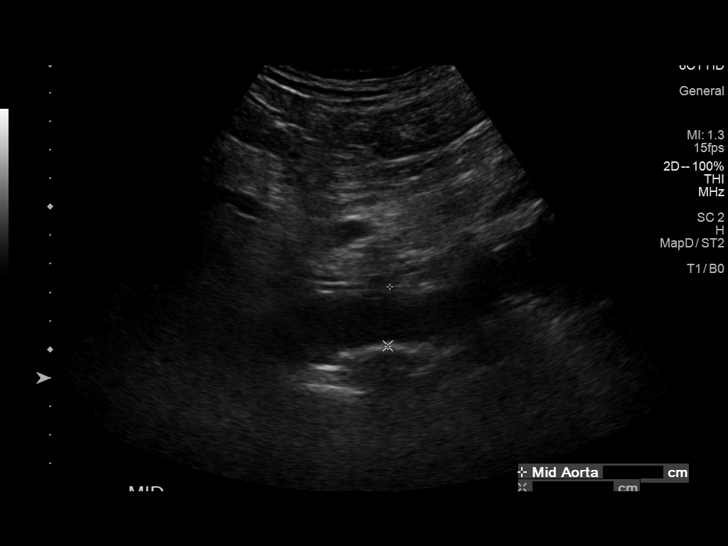
[im 4/19]
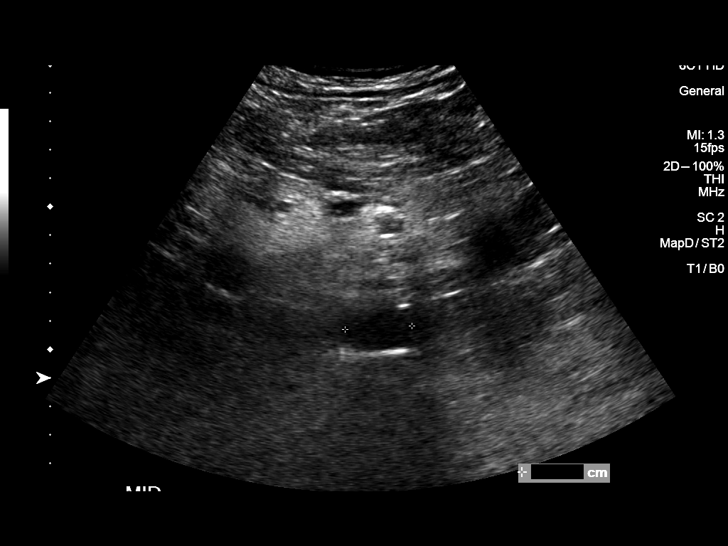
[im 5/19]
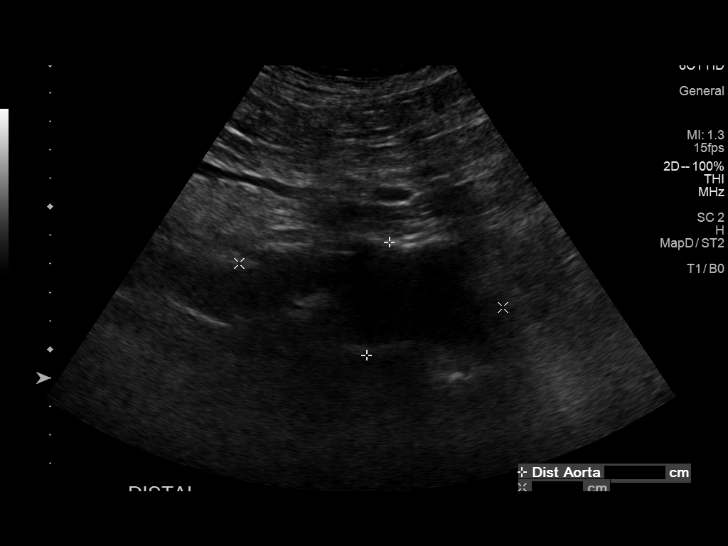
[im 7/19]
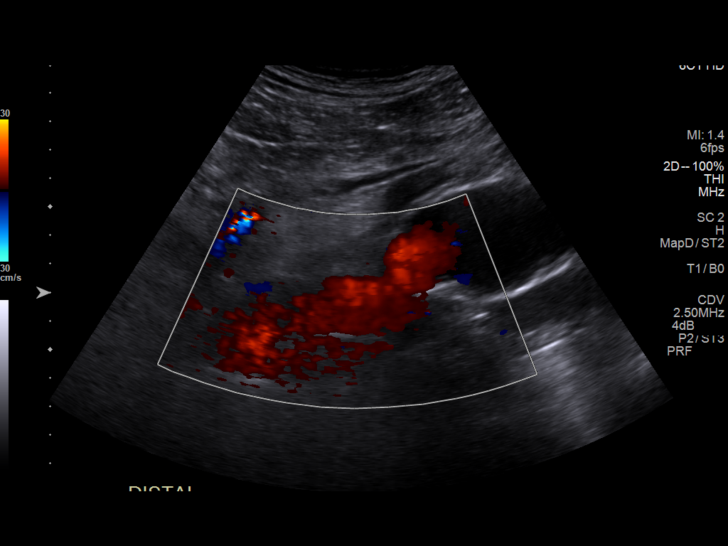
[im 8/19]
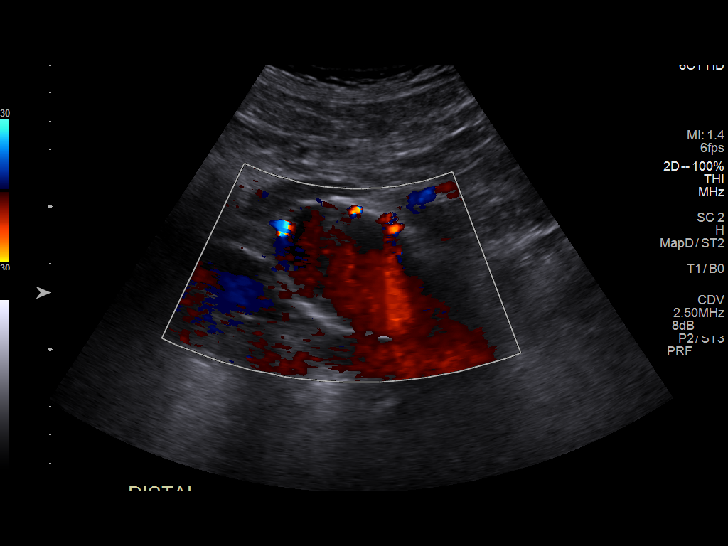
[im 9/19]
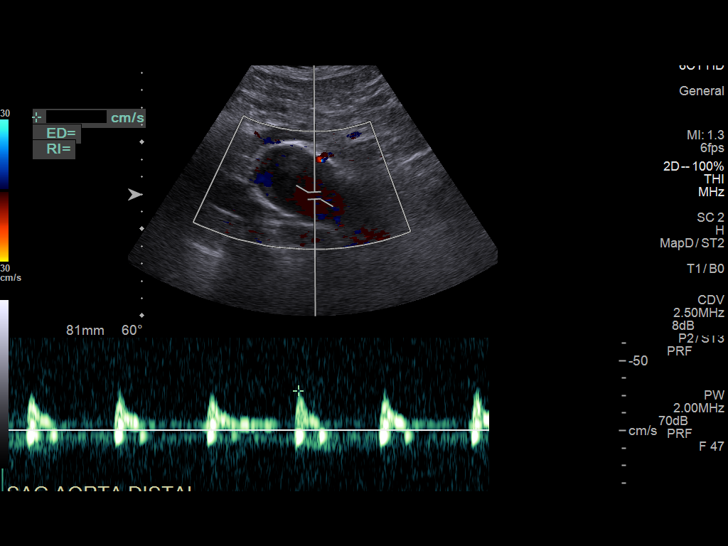
[im 11/19]
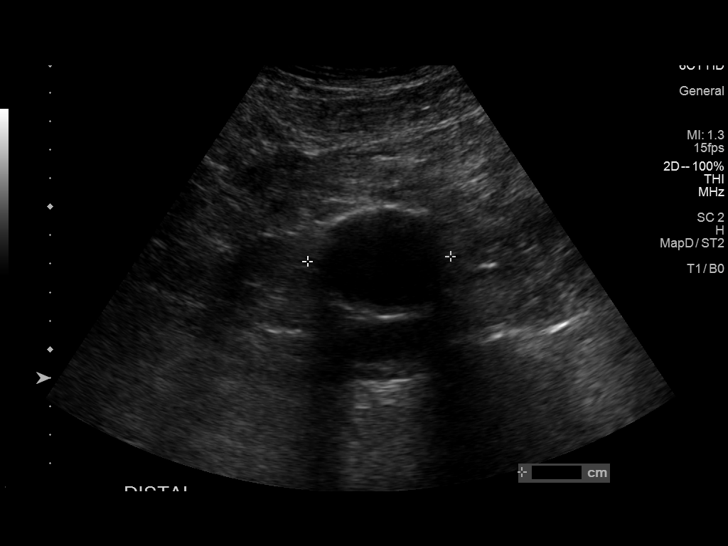
[im 12/19]
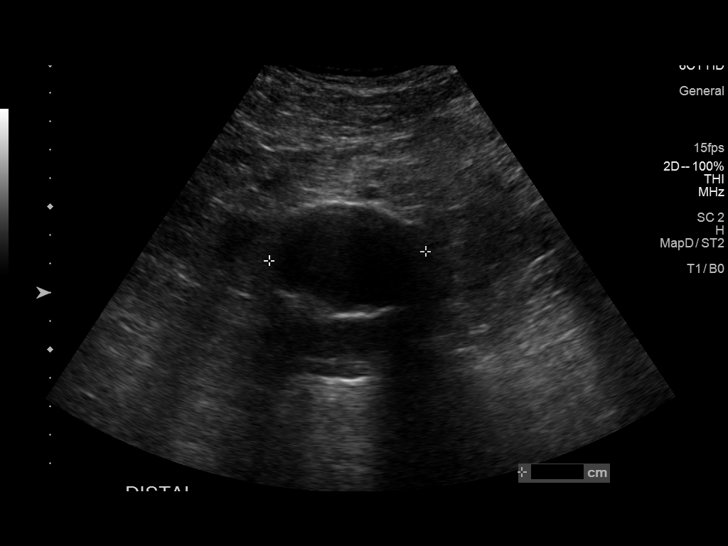
[im 13/19]
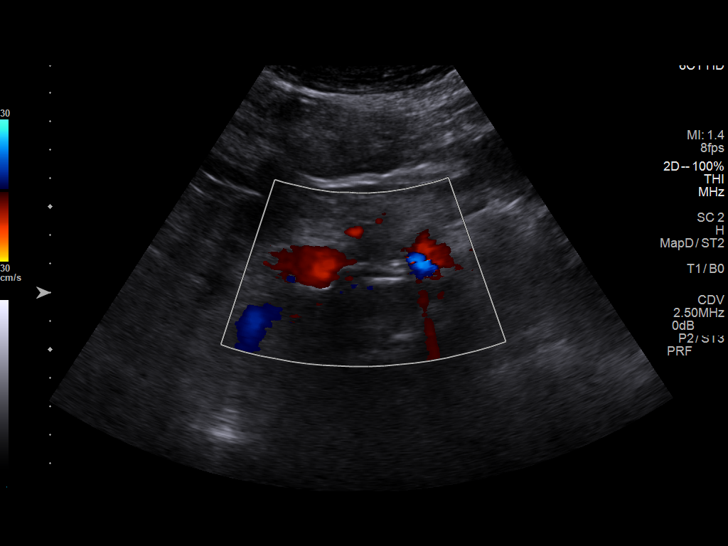
[im 15/19]
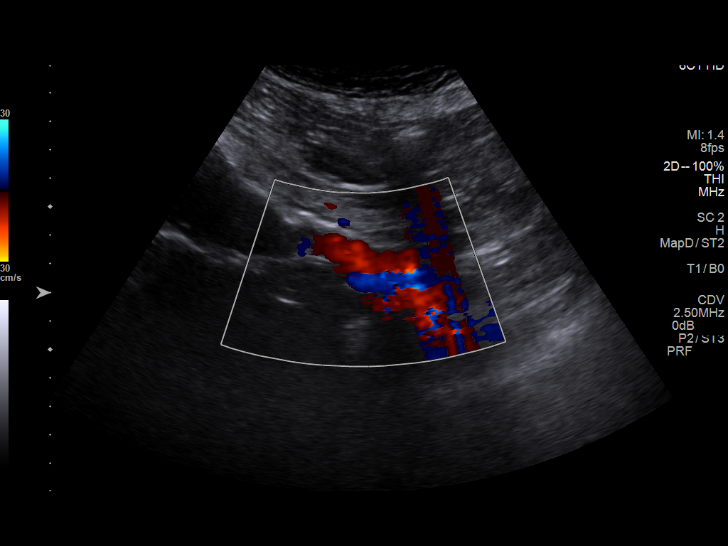
[im 16/19]
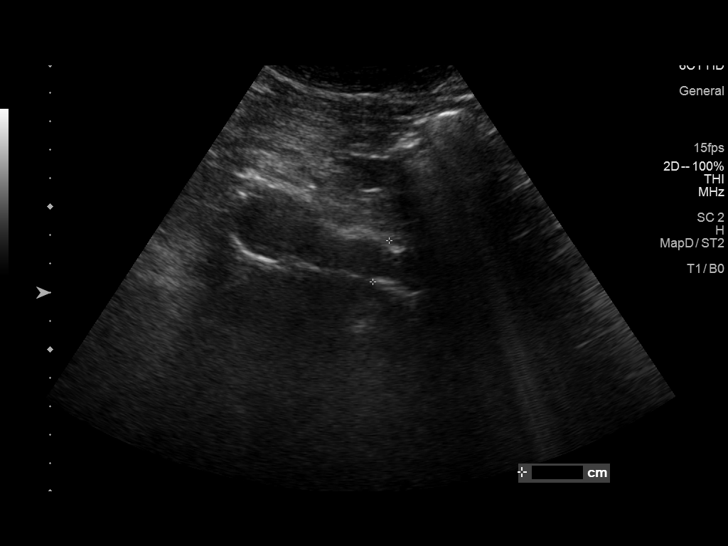
[im 17/19]
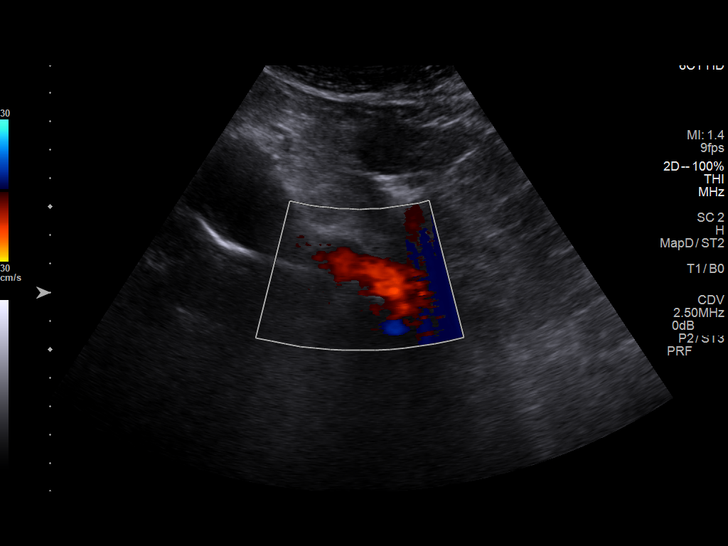
[im 19/19]
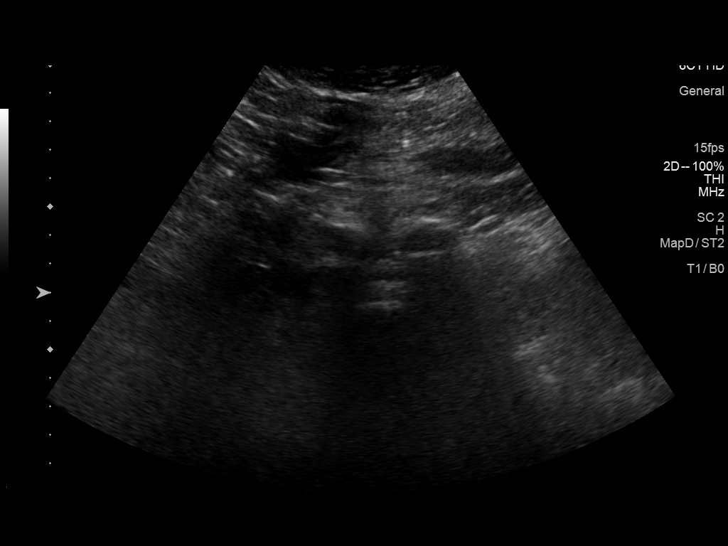

[14 of 19 positions shown; findings below may reference images not displayed]

FINDINGS: Abdominal aortic measurements as follows:

Proximal:  2.3 x 2.3 cm

Mid:  2.1 x 2.4 cm

Distal:  4.0 x 5.0 cm (previously 3.7 by 3.3)
Patent: Yes, peak systolic velocity is 28.5 cm/s

Right common iliac artery: 1.6 x 2.0 cm

Left common iliac artery: 1.6 x 1.3 cm
IMPRESSION: 1. 5 cm abdominal aortic aneurysm. (Previously measured 3.7 x
cm)
Recommend  referral to a vascular specialist.
2. There is low peak systolic velocity in the abdominal aorta which
can be seen in near occlusive settings.

These results were called by telephone at the time of interpretation
on 07/22/2021 at [DATE] to provider RAMSES FARAH , who verbally
acknowledged these results.

## 2023-02-12 ENCOUNTER — Ambulatory Visit (HOSPITAL_COMMUNITY)
Admission: RE | Admit: 2023-02-12 | Discharge: 2023-02-12 | Disposition: A | Discharge status: Home / Self Care | Payer: Medicare HMO | Source: Ambulatory Visit | Attending: Surgery | Admitting: Surgery

## 2023-02-12 ENCOUNTER — Encounter: Payer: Self-pay | Admitting: Surgery

## 2023-02-12 ENCOUNTER — Ambulatory Visit: Payer: Medicare HMO | Admitting: Surgery

## 2023-02-12 VITALS — BP 110/65 | HR 65 | Temp 97.9°F | Resp 20 | Ht 73.0 in | Wt 212.0 lb

## 2023-02-12 DIAGNOSIS — I7143 Infrarenal abdominal aortic aneurysm, without rupture: Secondary | ICD-10-CM

## 2023-02-12 NOTE — Progress Notes (Signed)
 [facility] [place]  Patient name: [name] MRN: [id number] DOB: 1940-05-18 Sex: male   REASON FOR VISIT:    Follow up  HISOTRY OF PRESENT ILLNESS:    [name] is a 83 y.o. male who returns today for follow-up of his abdominal aortic aneurysm.  This was initially detected in 2010 when it measured 3.1 cm.  He had a CT scan in 2023 that showed a maximum diameter of 4.2 cm.  6 months ago, ultrasound showed a 4.4 cm aneurysm.  He denies any abdominal pain or back pain.  He continues to be very active.  His home is heated by wood heat and he cuts off all of his wood.  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  The patient has a history of coronary artery disease.  He is status post CABG in 2007.  He is a former smoker.  He is on a statin for hypercholesterolemia.  He is medically managed for hypertension.  Ultrasound did not show any significant carotid disease.  ABIs were 0.7 range bilaterally PAST MEDICAL HISTORY:   Past Medical History:  Diagnosis Date   AAA (abdominal aortic aneurysm) (HCC)    a. 07/2021 CTA Abd/Pelvis: 4.6cm biloped infrarenal AAA.   Arthritis    CAD (coronary artery disease)    a. 2007 s/p CABG x 4 (LIMA-LAD, VG->Diag, VG->OM->LPDA); b.12/2007 MV: Attenuation artifact, low risk.   Carotid arterial disease (HCC)    a. 07/2021 U/S: 1-39% bilateral internal carotid artery stenosis.   Diverticulosis    History of kidney stones late 70's early 80's   HLD (hyperlipidemia)    HTN (hypertension)    Kidney stones    Myocardial infarction (HCC) 2008   light   PAD (peripheral artery disease) (HCC)    a. 07/2021 CTA Abd/pelvis: suspected hemodynamically significant left common femoral artery stenosis and subtotal occlusion of imaged portions of the bilateral superficial femoral arteries.   Pancreatic mass    a. 07/2021 CTA Abd/Pelvis: 1.6 cm enhancing lesion involving the caudal aspect of  the mid body of the pancreas without associated downstream pancreatic atrophy or ductal dilatation seen nonspecific.   Positional vertigo    Pulmonary nodules    Renal artery stenosis (HCC)    a. 07/2021 CTA Abd: suspected hemodynamically significant R RA stenosis.     FAMILY HISTORY:   Family History  Problem Relation Age of Onset   Alzheimer's disease Father    Colon cancer Father    Prostate cancer Father    Diabetes Sister    Prostate cancer Brother    Diabetes Mother     SOCIAL HISTORY:   Social History   Tobacco Use   Smoking status: Former    Types: Cigarettes   Smokeless tobacco: Never   Tobacco comments:    Quit in 21  Substance Use Topics   Alcohol use: No     ALLERGIES:   Allergies  Allergen Reactions   Codeine Nausea And Vomiting     CURRENT MEDICATIONS:   Current Outpatient Medications  Medication Sig Dispense Refill   acetaminophen  (TYLENOL  8 HOUR) 650 MG CR tablet Take 1 tablet (650 mg total) by mouth every 8 (eight) hours as needed for pain or fever. 30 tablet 0   amLODipine  (NORVASC ) 5 MG tablet Take 1 tablet (5 mg total) by mouth daily. 90 tablet 3   aspirin EC 81 MG tablet Take 1 tablet (81 mg total) by mouth  daily.     Bilberry 1000 MG CAPS Take 1 capsule by mouth daily.     ibuprofen (ADVIL,MOTRIN) 200 MG tablet Take 200 mg by mouth every 6 (six) hours as needed for headache or moderate pain.      lisinopril  (ZESTRIL ) 20 MG tablet TAKE 1 TABLET EVERY DAY 90 tablet 3   metoprolol  tartrate (LOPRESSOR ) 25 MG tablet Take 1 tablet (25 mg total) by mouth 2 (two) times daily. 180 tablet 2   Multiple Vitamins-Minerals (ONE-A-DAY EXTRAS ANTIOXIDANT) CAPS Take by mouth daily.     naproxen  (NAPROSYN ) 375 MG tablet Take 1 tablet (375 mg total) by mouth 2 (two) times daily. 10 tablet 0   neomycin -bacitracin -polymyxin (NEOSPORIN) ointment Apply 1 application topically every 12 (twelve) hours. 15 g 0   simvastatin  (ZOCOR ) 40 MG tablet Take 1 tablet (40 mg  total) by mouth daily at 6 PM. 90 tablet 2   tamsulosin  (FLOMAX ) 0.4 MG CAPS capsule TAKE 2 CAPSULES EVERY DAY 180 capsule 3   vitamin E 180 MG (400 UNITS) capsule Take 400 Units by mouth daily.     No current facility-administered medications for this visit.    REVIEW OF SYSTEMS:   [X]  denotes positive finding, [ ]  denotes negative finding Cardiac  Comments:  Chest pain or chest pressure:    Shortness of breath upon exertion:    Short of breath when lying flat:    Irregular heart rhythm:        Vascular    Pain in calf, thigh, or hip brought on by ambulation:    Pain in feet at night that wakes you up from your sleep:     Blood clot in your veins:    Leg swelling:         Pulmonary    Oxygen at home:    Productive cough:     Wheezing:         Neurologic    Sudden weakness in arms or legs:     Sudden numbness in arms or legs:     Sudden onset of difficulty speaking or slurred speech:    Temporary loss of vision in one eye:     Problems with dizziness:         Gastrointestinal    Blood in stool:     Vomited blood:         Genitourinary    Burning when urinating:     Blood in urine:        Psychiatric    Major depression:         Hematologic    Bleeding problems:    Problems with blood clotting too easily:        Skin    Rashes or ulcers:        Constitutional    Fever or chills:      PHYSICAL EXAM:   Vitals:   02/12/23 0831  BP: 110/65  Pulse: 65  Resp: 20  Temp: 97.9 F (36.6 C)  SpO2: 94%  Weight: 212 lb (96.2 kg)  Height: 6' 1 (1.854 m)    GENERAL: The patient is a well-nourished male, in no acute distress. The vital signs are documented above. CARDIAC: There is a regular rate and rhythm.  VASCULAR: No carotid bruits.  Pedal pulses are not palpable PULMONARY: Non-labored respirations ABDOMEN: Soft and non-tender MUSCULOSKELETAL: There are no major deformities or cyanosis. NEUROLOGIC: No focal weakness or paresthesias are detected. SKIN:  There are no ulcers or rashes  noted. PSYCHIATRIC: The patient has a normal affect.  STUDIES:   I have reviewed the following: Abdominal Aorta: There is evidence of abnormal dilatation of the mid  Abdominal aorta. The largest aortic measurement is 4.7 cm. The largest  aortic diameter has increased compared to prior exam.   MEDICAL ISSUES:   AAA: Maximum diameter continues to increase.  Today it measures 4.7 cm.  He is without symptoms.  I will have him follow-up in 6 months with repeat ultrasound.  We did discuss that he will likely need a CT scan to confirm his measurements based off the ultrasound in 6 months.    Malvina Serene CLORE, MD, FACS [facility] [phone] Pager [phone]

## 2023-02-20 ENCOUNTER — Other Ambulatory Visit: Payer: Self-pay

## 2023-02-20 DIAGNOSIS — I7143 Infrarenal abdominal aortic aneurysm, without rupture: Secondary | ICD-10-CM

## 2023-03-12 ENCOUNTER — Other Ambulatory Visit: Payer: Self-pay | Admitting: Nurse Practitioner

## 2023-03-12 NOTE — Telephone Encounter (Unsigned)
 Copied from CRM [id number]. Topic: Clinical - Medication Refill >> Mar 12, 2023  5:09 PM [name] wrote: Most Recent Primary Care Visit:  Provider: Dorothe Gaster  Department: [facility]  Visit Type: PHYSICAL  Date: 08/15/2022  Medication: metoprolol  tartrate (LOPRESSOR ) 25 MG tablet  Has the patient contacted their pharmacy? Yes (Agent: If no, request that the patient contact the pharmacy for the refill. If patient does not wish to contact the pharmacy document the reason why and proceed with request.) (Agent: If yes, when and what did the pharmacy advise?)  Is this the correct pharmacy for this prescription? Yes If no, delete pharmacy and type the correct one.  This is the patient's preferred pharmacy:  [facility] - [place], [place] - [address] [place] [address] Phone: [phone] Fax: [phone]  [id number] - [place], [place] - [address] AT [facility] [address] [facility] [address] [place] [address] Phone: [phone] Fax: [phone]   Has the prescription been filled recently? No  Is the patient out of the medication? Yes  Has the patient been seen for an appointment in the last year OR does the patient have an upcoming appointment? Yes  Can we respond through MyChart? Yes  Agent: Please be advised that Rx refills may take up to 3 business days. We ask that you follow-up with your pharmacy.

## 2023-03-13 ENCOUNTER — Telehealth: Payer: Self-pay | Admitting: Nurse Practitioner

## 2023-03-13 NOTE — Telephone Encounter (Signed)
Copied from CRM [id number]. Topic: Clinical - Prescription Issue >> Mar 13, 2023  3:46 PM Gurney Maxin H wrote: Reason for CRM: Patient calling to see if he can have a prescription for his metoprolol tartrate (LOPRESSOR) 25 MG tablet sent to the [facility] on [facility], states [facility] supposedly shipped his medication out 2 weeks ago and he still hasn't received it and has been out for 2 days.  [employer] [phone]

## 2023-03-14 ENCOUNTER — Other Ambulatory Visit (HOSPITAL_COMMUNITY): Payer: Self-pay

## 2023-03-14 ENCOUNTER — Telehealth: Payer: Self-pay | Admitting: Cardiovascular Disease

## 2023-03-14 MED ORDER — METOPROLOL TARTRATE 25 MG PO TABS
25.0000 mg | ORAL_TABLET | Freq: Two times a day (BID) | ORAL | 3 refills | Status: DC
  Filled 2023-03-14: qty 180, 90d supply, fill #0

## 2023-03-14 MED ORDER — METOPROLOL TARTRATE 25 MG PO TABS: 25.0000 mg | ORAL_TABLET | Freq: Two times a day (BID) | ORAL | 3 refills | Status: DC

## 2023-03-14 NOTE — Telephone Encounter (Signed)
Copied from CRM [id number]. Topic: Clinical - Prescription Issue >> Mar 13, 2023  5:06 PM [name] wrote: Reason for CRM: Patient's granddaughter calling in because patient has been without blood pressure medication (metoprolol tartrate (LOPRESSOR) 25 MG tablet) for two days.Patient would like an update as soon as possible.

## 2023-03-14 NOTE — Addendum Note (Signed)
Addended by: Marilynne Halsted on: 03/14/2023 02:57 PM   Modules accepted: Orders

## 2023-03-14 NOTE — Telephone Encounter (Signed)
Patient's granddaughter is following up. She states the prescription was sent to the wrong pharmacy and it needs to be transferred to [facility]. She would like a call back to confirm when it has been sent.  [id number] - [place], [place] - [address]

## 2023-03-14 NOTE — Telephone Encounter (Signed)
Contacted pt granddaughter for clarification.   [name] on DPR stated that pt has been without BP meds for 2 days.  Complains that Cardiology advised her to reach out to PCP for refills of Lopressor.  Pt's granddaughter also stated that pt is upset and will come to office to sue for not refilling medication. Advised to granddaughter that [facility] will be contacted for an update and refill will be reviewed by PCP.   Asked granddaughter if pt is showing symptoms of high blood pressure or heart attack.  Pt's Granddaughter stated no but then stated that she is not with the pt to see how he is doing.   Asked pt's granddaughter if pt has a way to check BP levels at home, granddaughter says he does but was advised by Cardiologist to not check BP often.   Lopressor 25 MG

## 2023-03-14 NOTE — Telephone Encounter (Signed)
*  STAT* If patient is at the pharmacy, call can be transferred to refill team.   1. Which medications need to be refilled? (please list name of each medication and dose if known) metoprolol tartrate (LOPRESSOR) 25 MG tablet   2. Which pharmacy/location (including street and city if local pharmacy) is medication to be sent to? [id number] - [place], [place] - [address]   3. Do they need a 30 day or 90 day supply? 90   Patient has been out of medication for 4 days

## 2023-03-14 NOTE — Telephone Encounter (Signed)
Refill sent to pharmacy as requested

## 2023-03-14 NOTE — Telephone Encounter (Signed)
Looks like medication was refilled today by cardiology provider

## 2023-03-14 NOTE — Telephone Encounter (Signed)
Spoke w/ pt's granddaughter and advised her that I sent refill [facility] as requested. She is appreciative of the call.

## 2023-03-16 ENCOUNTER — Other Ambulatory Visit: Payer: Self-pay | Admitting: Cardiovascular Disease

## 2023-04-19 ENCOUNTER — Other Ambulatory Visit: Payer: Self-pay | Admitting: Nurse Practitioner

## 2023-04-19 NOTE — Telephone Encounter (Signed)
 Refill request

## 2023-06-15 ENCOUNTER — Other Ambulatory Visit: Payer: Self-pay | Admitting: Cardiovascular Disease

## 2023-06-21 ENCOUNTER — Ambulatory Visit: Attending: Nurse Practitioner | Admitting: Nurse Practitioner

## 2023-06-21 ENCOUNTER — Encounter: Payer: Self-pay | Admitting: Nurse Practitioner

## 2023-06-21 VITALS — BP 120/67 | HR 62 | Ht 73.5 in | Wt 210.0 lb

## 2023-06-21 DIAGNOSIS — I714 Abdominal aortic aneurysm, without rupture, unspecified: Secondary | ICD-10-CM

## 2023-06-21 DIAGNOSIS — I2510 Atherosclerotic heart disease of native coronary artery without angina pectoris: Secondary | ICD-10-CM | POA: Diagnosis not present

## 2023-06-21 DIAGNOSIS — E785 Hyperlipidemia, unspecified: Secondary | ICD-10-CM | POA: Diagnosis not present

## 2023-06-21 DIAGNOSIS — I10 Essential (primary) hypertension: Secondary | ICD-10-CM | POA: Diagnosis not present

## 2023-06-21 DIAGNOSIS — I739 Peripheral vascular disease, unspecified: Secondary | ICD-10-CM

## 2023-06-21 MED ORDER — AMLODIPINE BESYLATE 2.5 MG PO TABS
2.5000 mg | ORAL_TABLET | Freq: Every day | ORAL | 3 refills | Status: DC
Start: 2023-06-21 — End: 2023-09-29

## 2023-06-21 NOTE — Patient Instructions (Signed)
 Medication Instructions:  Your physician recommends the following medication changes.  DECREASE: Amlodipine  to 2.5 mg by mouth daily   *If you need a refill on your cardiac medications before your next appointment, please call your pharmacy*  Lab Work: No labs ordered today   Testing/Procedures: No test ordered today   Follow-Up: At [facility], you and your health needs are our priority.  As part of our continuing mission to provide you with exceptional heart care, our providers are all part of one team.  This team includes your primary Cardiologist (physician) and Advanced Practice Providers or APPs (Physician Assistants and Nurse Practitioners) who all work together to provide you with the care you need, when you need it.  Your next appointment:   1 year(s)  Provider:   You may see Timothy Gollan, MD or one of the following Advanced Practice Providers on your designated Care Team:   Laneta Pintos, NP Gildardo Labrador, PA-C Varney Gentleman, PA-C Cadence Florence, PA-C Ronald Cockayne, NP Morey Ar, NP

## 2023-06-21 NOTE — Progress Notes (Signed)
 Office Visit    Patient Name: [name] Date of Encounter: 06/21/2023  Primary Care Provider:  Candiss Chamorro, MD Primary Cardiologist:  Belva Boyden, MD  Chief Complaint    83 y.o. male with history of CAD status post coronary artery bypass grafting, hypertension, hyperlipidemia, peripheral arterial disease, and infrarenal abdominal aortic aneurysm, who presents for follow-up related to CAD.   Past Medical History   Subjective   Past Medical History:  Diagnosis Date   AAA (abdominal aortic aneurysm) (HCC)    a. 07/2021 CTA Abd/Pelvis: 4.6cm biloped infrarenal AAA.   Arthritis    CAD (coronary artery disease)    a. 2007 s/p CABG x 4 (LIMA-LAD, VG->Diag, VG->OM->LPDA); b.12/2007 MV: Attenuation artifact, low risk.   Carotid arterial disease (HCC)    a. 07/2021 U/S: 1-39% bilateral internal carotid artery stenosis.   Diverticulosis    History of kidney stones late 70's early 80's   HLD (hyperlipidemia)    HTN (hypertension)    Kidney stones    Myocardial infarction (HCC) 2008   light   PAD (peripheral artery disease) (HCC)    a. 07/2021 CTA Abd/pelvis: suspected hemodynamically significant left common femoral artery stenosis and subtotal occlusion of imaged portions of the bilateral superficial femoral arteries.   Pancreatic mass    a. 07/2021 CTA Abd/Pelvis: 1.6 cm enhancing lesion involving the caudal aspect of the mid body of the pancreas without associated downstream pancreatic atrophy or ductal dilatation seen nonspecific.   Positional vertigo    Pulmonary nodules    Renal artery stenosis (HCC)    a. 07/2021 CTA Abd: suspected hemodynamically significant R RA stenosis.   Past Surgical History:  Procedure Laterality Date   bypass surgery (otheR)  August 2007   COLONOSCOPY     CORONARY ARTERY BYPASS GRAFT     SHOULDER SURGERY     TONSILLECTOMY      Allergies  Allergies  Allergen Reactions   Codeine Nausea And Vomiting       History of Present  Illness      83 y.o. y/o male with history of CAD status post coronary artery bypass grafting, hypertension, hyperlipidemia, peripheral arterial disease, and infrarenal abdominal aortic aneurysm. As noted, he underwent CABG x 4 in 2007 with a LIMA to the LAD, vein graft to the diagonal, and sequential vein graft to the obtuse marginal and LPDA. Subsequent stress testing in 2009 was felt to be low risk.  Abdominal ultrasound 2023 showed a 5 cm abdominal aortic aneurysm.  He was seen by vascular surgery and underwent CTA of the abdomen and pelvis which showed a 4.6 cm bilobed infrarenal abdominal aortic aneurysm.  CT was also notable for incidental findings of left renal artery stenosis, bilateral femoral artery disease, pancreatic lesion, diverticulosis, prostatomegaly, and left lower lobe pulmonary nodule as well as ill-defined groundglass opacity in the left upper lobe.  Patient opted to forego MRI to evaluate pancreatic lesion.  Follow-up abdominal ultrasounds in July 2024 showed stable 4.4 cm abdominal aortic aneurysm, though this increased to 4.7 cm on January 2025 study.   Mr. Colgan was last seen in cardiology clinic in May 2024, at which time he was doing well from a cardiac standpoint.  In addition to vascular imaging as outlined above, he also had a repeat CT of his chest in September 2024 showing persistent groundglass opacities in the left lung with recommendation for follow-up CT in 1 year.  Left lower lobe nodule was stable at 6 mm.  He was found to have up to 2.3 cm right thyroid  nodules with recommendation for thyroid  ultrasound.  Subsequent thyroid  ultrasound showed borderline thyromegaly with findings suggestive of multinodular goiter.  Recommendation was made for needle biopsy of a 1.5 cm nodule.  From a cardiac standpoint, Mr. Seabrook has done well over the past year.  He remains active, building and extending a chicken coop in his yard.  He walks a fair amount as well.  He does not  experience chest pain or dyspnea.  He had an episode about 2 to 3 weeks ago where he stood up in his garage and felt dizzy.  He stated himself and sat on a bucket and symptoms resolved within 30 seconds to a minute.  He has had no recurrence of the symptoms.  He denies palpitations, PND, orthopnea, syncope, edema, or early satiety. Objective   Home Medications    Current Outpatient Medications  Medication Sig Dispense Refill   acetaminophen  (TYLENOL  8 HOUR) 650 MG CR tablet Take 1 tablet (650 mg total) by mouth every 8 (eight) hours as needed for pain or fever. 30 tablet 0   aspirin EC 81 MG tablet Take 1 tablet (81 mg total) by mouth daily.     Bilberry 1000 MG CAPS Take 1 capsule by mouth daily.     ibuprofen (ADVIL,MOTRIN) 200 MG tablet Take 200 mg by mouth every 6 (six) hours as needed for headache or moderate pain.      lisinopril  (ZESTRIL ) 20 MG tablet TAKE 1 TABLET EVERY DAY 90 tablet 3   metoprolol  tartrate (LOPRESSOR ) 25 MG tablet Take 1 tablet (25 mg total) by mouth 2 (two) times daily. 180 tablet 3   Multiple Vitamins-Minerals (ONE-A-DAY EXTRAS ANTIOXIDANT) CAPS Take by mouth daily.     naproxen  (NAPROSYN ) 375 MG tablet Take 1 tablet (375 mg total) by mouth 2 (two) times daily. 10 tablet 0   neomycin -bacitracin -polymyxin (NEOSPORIN) ointment Apply 1 application topically every 12 (twelve) hours. 15 g 0   simvastatin  (ZOCOR ) 40 MG tablet TAKE 1 TABLET EVERY DAY AT 6:00PM 90 tablet 0   tamsulosin  (FLOMAX ) 0.4 MG CAPS capsule TAKE 2 CAPSULES EVERY DAY 180 capsule 3   vitamin E 180 MG (400 UNITS) capsule Take 400 Units by mouth daily.     amLODipine  (NORVASC ) 2.5 MG tablet Take 1 tablet (2.5 mg total) by mouth daily. 90 tablet 3   No current facility-administered medications for this visit.     Physical Exam    VS:  BP 120/67 (BP Location: Left Arm, Patient Position: Sitting, Cuff Size: Normal)   Pulse 62   Ht 6' 1.5" (1.867 m)   Wt 210 lb (95.3 kg)   SpO2 96%   BMI 27.33 kg/m   , BMI Body mass index is 27.33 kg/m.     Blood pressure lying 104/66, heart rate 62 Blood pressure sitting 120/67, heart rate 62 Blood pressure standing-0 minutes 109/62, heart rate 64 Blood pressure standing-3 minutes 118/62, heart rate 59      GEN: Well nourished, well developed, in no acute distress. HEENT: normal. Neck: Supple, no JVD, carotid bruits, or masses. Cardiac: RRR, no murmurs, rubs, or gallops. No clubbing, cyanosis, edema.  Radials 2+/PT 2+ and equal bilaterally.  Respiratory:  Respirations regular and unlabored, clear to auscultation bilaterally. GI: Soft, nontender, nondistended, BS + x 4. MS: no deformity or atrophy. Skin: warm and dry, no rash. Neuro:  Strength and sensation are intact. Psych: Normal affect.  Accessory Clinical Findings  ECG personally reviewed by me today - EKG Interpretation Date/Time:  Thursday Jun 21 2023 11:42:07 EDT Ventricular Rate:  57 PR Interval:  234 QRS Duration:  118 QT Interval:  418 QTC Calculation: 406 R Axis:   97  Text Interpretation: Sinus bradycardia with 1st degree A-V block Rightward axis Non-specific intra-ventricular conduction delay Nonspecific ST abnormality Confirmed by Laneta Pintos [phone]) on 06/21/2023 11:58:12 AM  - no acute changes.  Lab Results  Component Value Date   WBC 7.9 08/15/2022   HGB 14.9 08/15/2022   HCT 46.1 08/15/2022   MCV 98.2 08/15/2022   PLT 267.0 08/15/2022   Lab Results  Component Value Date   CREATININE 1.16 08/15/2022   BUN 24 (H) 08/15/2022   NA 139 08/15/2022   K 4.7 08/15/2022   CL 108 08/15/2022   CO2 26 08/15/2022   Lab Results  Component Value Date   ALT 18 08/15/2022   AST 17 08/15/2022   ALKPHOS 69 08/15/2022   BILITOT 0.8 08/15/2022   Lab Results  Component Value Date   CHOL 105 08/15/2022   HDL 34.50 (L) 08/15/2022   LDLCALC 57 08/15/2022   TRIG 65.0 08/15/2022   CHOLHDL 3 08/15/2022    Lab Results  Component Value Date   TSH 2.76 02/23/2022        Assessment & Plan    1.  Coronary artery disease: Status post CABG x 4 in 2007 with low risk Myoview 2009.  He has done well over the past year without chest pain or dyspnea and remains active.  Continue beta-blocker, ACE inhibitor, aspirin and statin therapy.  2.  Primary hypertension/orthostatic hypotension: Patient recently had an episode of dizziness after standing.  His blood pressure was soft initially today at 98/60.  We obtained orthostatics and he did have a drop from sitting to standing though was not symptomatic.  Pressures remain relatively soft throughout.  I reduced his amlodipine  dose to 2.5 mg daily.  He otherwise remains on beta-blocker and ACE inhibitor therapy.  3.  Hyperlipidemia: LDL of 57 in July 2024 with normal LFTs at that time.  He remains on statin therapy.  4.  Infrarenal abdominal aortic aneurysm: 4.7 cm by ultrasound in January 2025.  Now followed by vascular surgery.  Blood pressure stable to soft.  5.  Peripheral arterial disease: Prior CT suggestive of renal artery stenosis and bilateral lower extremity arterial disease.  Patient is active without claudication has blood pressure well-controlled.  Continue aspirin and statin therapy.  6.  Disposition:  F/u in 1 year or sooner if necessary.  Laneta Pintos, NP 06/21/2023, 12:37 PM

## 2023-08-06 ENCOUNTER — Ambulatory Visit (HOSPITAL_COMMUNITY): Payer: Medicare HMO | Attending: Surgery | Admitting: Surgery

## 2023-08-06 ENCOUNTER — Ambulatory Visit (HOSPITAL_COMMUNITY): Admission: RE | Admit: 2023-08-06 | Payer: Medicare HMO | Source: Ambulatory Visit

## 2023-08-23 ENCOUNTER — Encounter: Payer: Self-pay | Admitting: Emergency Medicine

## 2023-08-23 ENCOUNTER — Ambulatory Visit
Admission: EM | Admit: 2023-08-23 | Discharge: 2023-08-23 | Disposition: A | Discharge status: Home / Self Care | Attending: Emergency Medicine | Admitting: Emergency Medicine

## 2023-08-23 DIAGNOSIS — S61402A Unspecified open wound of left hand, initial encounter: Secondary | ICD-10-CM | POA: Diagnosis not present

## 2023-08-23 DIAGNOSIS — M7989 Other specified soft tissue disorders: Secondary | ICD-10-CM

## 2023-08-23 MED ORDER — PREDNISONE 20 MG PO TABS: 40.0000 mg | ORAL_TABLET | Freq: Every day | ORAL | 0 refills | Status: DC

## 2023-08-23 MED ORDER — KETOROLAC TROMETHAMINE 30 MG/ML IJ SOLN
30.0000 mg | Freq: Once | INTRAMUSCULAR | Status: AC
  Administered 2023-08-23: 30 mg via INTRAMUSCULAR

## 2023-08-23 MED ORDER — CEPHALEXIN 500 MG PO CAPS: 500.0000 mg | ORAL_CAPSULE | Freq: Two times a day (BID) | ORAL | 0 refills | Status: AC

## 2023-08-23 NOTE — ED Triage Notes (Signed)
 Patient reports Rooster bite to left hand x 4 days. Patient reports pain and swelling. Rates pain 10/10. Took Tylenol  yesterday.

## 2023-08-23 NOTE — ED Provider Notes (Signed)
 [name]    CSN: [id number] Arrival date & time: 08/23/23  9096      History   Chief Complaint Chief Complaint  Patient presents with   Animal Bite    HPI [name] is a 83 y.o. male.   Patient presents for evaluation of pain and swelling to the right hand after he was picked up by a rooster at his home.  Endorses some drainage.  Denies fever.  Has taken ibuprofen.  Past Medical History:  Diagnosis Date   AAA (abdominal aortic aneurysm) (HCC)    a. 07/2021 CTA Abd/Pelvis: 4.6cm biloped infrarenal AAA.   Arthritis    CAD (coronary artery disease)    a. 2007 s/p CABG x 4 (LIMA-LAD, VG->Diag, VG->OM->LPDA); b.12/2007 MV: Attenuation artifact, low risk.   Carotid arterial disease (HCC)    a. 07/2021 U/S: 1-39% bilateral internal carotid artery stenosis.   Diverticulosis    History of kidney stones late 70's early 80's   HLD (hyperlipidemia)    HTN (hypertension)    Kidney stones    Myocardial infarction (HCC) 2008   light   PAD (peripheral artery disease) (HCC)    a. 07/2021 CTA Abd/pelvis: suspected hemodynamically significant left common femoral artery stenosis and subtotal occlusion of imaged portions of the bilateral superficial femoral arteries.   Pancreatic mass    a. 07/2021 CTA Abd/Pelvis: 1.6 cm enhancing lesion involving the caudal aspect of the mid body of the pancreas without associated downstream pancreatic atrophy or ductal dilatation seen nonspecific.   Positional vertigo    Pulmonary nodules    Renal artery stenosis (HCC)    a. 07/2021 CTA Abd: suspected hemodynamically significant R RA stenosis.    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 Authorizing Provider  cephALEXin  (KEFLEX ) 500 MG capsule Take 1 capsule (500 mg total) by mouth 2 (two) times daily for 5 days. 08/23/23 08/28/23 Yes Vi Whitesel R, NP  predniSONE  (DELTASONE ) 20 MG tablet Take 2 tablets (40 mg total) by mouth daily. 08/23/23  Yes Dawud Mays R, NP  acetaminophen  (TYLENOL  8 HOUR) 650 MG CR tablet Take 1 tablet (650 mg total) by mouth every 8 (eight) hours as needed for pain or fever. 02/15/18   Charlyn Sora, MD  amLODipine  (NORVASC ) 2.5 MG tablet Take 1 tablet (2.5 mg total) by mouth daily. 06/21/23   Vivienne Lonni Ingle, NP  aspirin EC 81 MG tablet Take 1 tablet (81 mg total) by mouth daily. 10/05/17   Gollan, Timothy J, MD  Bilberry 1000 MG CAPS Take 1 capsule by mouth daily.    [provider]  ibuprofen (ADVIL,MOTRIN) 200 MG tablet Take 200 mg by mouth every 6 (six) hours as needed for headache or moderate pain.     [provider]  lisinopril  (ZESTRIL ) 20 MG tablet TAKE 1 TABLET EVERY DAY 09/28/22   Gollan, Timothy J, MD  metoprolol  tartrate (LOPRESSOR ) 25 MG tablet Take 1 tablet (25 mg total) by mouth 2 (two) times daily. 03/14/23   Gollan, Timothy J, MD  Multiple Vitamins-Minerals (ONE-A-DAY EXTRAS ANTIOXIDANT) CAPS Take by mouth daily.    [provider]  naproxen  (NAPROSYN ) 375 MG tablet Take 1 tablet (375 mg total) by mouth 2 (two) times daily. 02/15/18   Charlyn Sora, MD  neomycin -bacitracin -polymyxin (NEOSPORIN) ointment Apply 1 application topically every 12 (twelve) hours. 02/15/18   Charlyn Sora, MD  simvastatin  (ZOCOR ) 40 MG tablet TAKE 1 TABLET EVERY  DAY AT 6:00PM 06/15/23   Gollan, Timothy J, MD  tamsulosin  (FLOMAX ) 0.4 MG CAPS capsule TAKE 2 CAPSULES EVERY DAY 01/08/23   Wendee Lynwood HERO, NP  vitamin E 180 MG (400 UNITS) capsule Take 400 Units by mouth daily.    [provider]    Family History Family History  Problem Relation Age of Onset   Alzheimer's disease Father    Colon cancer Father    Prostate cancer Father    Diabetes Sister    Prostate cancer Brother    Diabetes Mother     Social History Social History   Tobacco Use   Smoking status: Former    Types: Cigarettes   Smokeless tobacco: Never   Tobacco comments:    Quit in 85  Vaping Use   Vaping status: Never Used  Substance Use Topics   Alcohol use: No   Drug use: No     Allergies   Codeine   Review of Systems Review of Systems   Physical Exam Triage Vital Signs ED Triage Vitals  Encounter Vitals Group     BP 08/23/23 0923 113/70     Girls Systolic BP Percentile --      Girls Diastolic BP Percentile --      Boys Systolic BP Percentile --      Boys Diastolic BP Percentile --      Pulse Rate 08/23/23 0923 94     Resp 08/23/23 0923 20     Temp 08/23/23 0923 98.6 F (37 C)     Temp Source 08/23/23 0923 Oral     SpO2 08/23/23 0923 98 %     Weight 08/23/23 0926 204 lb 6.4 oz (92.7 kg)     Height --      Head Circumference --      Peak Flow --      Pain Score 08/23/23 0920 10     Pain Loc --      Pain Education --      Exclude from Growth Chart --    No data found.  Updated Vital Signs BP 113/70 (BP Location: Left Arm)   Pulse 94   Temp 98.6 F (37 C) (Oral)   Resp 20   Wt 204 lb 6.4 oz (92.7 kg)   SpO2 98%   BMI 26.60 kg/m   Visual Acuity Right Eye Distance:   Left Eye Distance:   Bilateral Distance:    Right Eye Near:   Left Eye Near:    Bilateral Near:     Physical Exam Constitutional:      Appearance: Normal appearance.  Eyes:     Extraocular Movements: Extraocular movements intact.  Pulmonary:     Effort:  Pulmonary effort is normal.  Skin:    Comments: Scabbed wound present to the dorsum aspect of the  left hand with surrounding erythema and generalized swelling, no drainage noted  Neurological:     Mental Status: He is alert and oriented to person, place, and time. Mental status is at baseline.      UC Treatments / Results  Labs (all labs ordered are listed, but only abnormal results are displayed) Labs Reviewed - No data to display  EKG   Radiology No results found.  Procedures Procedures (including critical care time)  Medications Ordered in UC Medications  ketorolac  (TORADOL ) 30 MG/ML injection 30 mg (30 mg Intramuscular Given 08/23/23 0946)    Initial Impression / Assessment and Plan / UC Course  I have reviewed the triage vital signs and the nursing notes.  Pertinent labs & imaging results that were available during my care of the patient were reviewed by me and considered in my medical decision making (see chart for details).  Open wound of left hand without foreign body, swelling of the left hand  Concerning for infection as it occurred from an animal, prescribed cephalexin , Toradol  IM given for management of pain and prescribed prednisone  for home use recommended daily cleansing and ice and heat for additional support advised follow-up with primary doctor or urgent care for reevaluation as needed Final Clinical Impressions(s) / UC Diagnoses   Final diagnoses:  Open wound of left hand without foreign body, unspecified wound type, initial encounter  Swelling of left hand     Discharge Instructions      You were evaluated for the pain and swelling to your hand  Animals mouth/beaks are considered to be dirty and therefore you be placed on antibiotics to get rid of any germs causing symptoms  Take cephalexin  twice daily for 5 days  You have been given an injection of Toradol  to help reduce inflammation and help with pain and ideally will start to see improvement  within the next hour  Started tomorrow take prednisone  every morning with food for 5 days to continue that process for pain management, may take Tylenol  additionally  May have ice or heat to the hand for comfort  If you have any concerns regarding healing please follow-up with urgent care or your primary doctor for recheck   ED Prescriptions     Medication Sig Dispense Auth. Provider   predniSONE  (DELTASONE ) 20 MG tablet Take 2 tablets (40 mg total) by mouth daily. 10 tablet Deloris Mittag R, NP   cephALEXin  (KEFLEX ) 500 MG capsule Take 1 capsule (500 mg total) by mouth 2 (two) times daily for 5 days. 10 capsule Leo Weyandt R, NP      PDMP not reviewed this encounter.   Teresa Shelba SAUNDERS, NP 08/23/23 1013

## 2023-08-23 NOTE — Discharge Instructions (Signed)
 You were evaluated for the pain and swelling to your hand  Animals mouth/beaks are considered to be dirty and therefore you be placed on antibiotics to get rid of any germs causing symptoms  Take cephalexin  twice daily for 5 days  You have been given an injection of Toradol  to help reduce inflammation and help with pain and ideally will start to see improvement within the next hour  Started tomorrow take prednisone  every morning with food for 5 days to continue that process for pain management, may take Tylenol  additionally  May have ice or heat to the hand for comfort  If you have any concerns regarding healing please follow-up with urgent care or your primary doctor for recheck

## 2023-09-27 ENCOUNTER — Other Ambulatory Visit: Payer: Self-pay | Admitting: Nurse Practitioner

## 2023-09-27 ENCOUNTER — Other Ambulatory Visit: Payer: Self-pay | Admitting: Cardiovascular Disease

## 2023-09-28 ENCOUNTER — Telehealth: Payer: Self-pay | Admitting: Cardiovascular Disease

## 2023-09-28 MED ORDER — LISINOPRIL 20 MG PO TABS: 20.0000 mg | ORAL_TABLET | Freq: Every day | ORAL | 2 refills | Status: DC

## 2023-09-28 NOTE — Telephone Encounter (Signed)
*  STAT* If patient is at the pharmacy, call can be transferred to refill team.   1. Which medications need to be refilled? (please list name of each medication and dose if known) lisinopril  (ZESTRIL ) 20 MG tablet    2. Would you like to learn more about the convenience, safety, & potential cost savings by using the [facility]?      3. Are you open to using the [facility] (Type [facility]. ).   4. Which pharmacy/location (including street and city if local pharmacy) is medication to be sent to? [id number] - [place], [place] - [address]    5. Do they need a 30 day or 90 day supply? 30 day

## 2023-09-28 NOTE — Telephone Encounter (Signed)
 Pt's medication was sent to pt's pharmacy as requested. Confirmation received.

## 2023-09-29 ENCOUNTER — Other Ambulatory Visit: Payer: Self-pay | Admitting: Family

## 2023-09-29 MED ORDER — AMLODIPINE BESYLATE 2.5 MG PO TABS: 2.5000 mg | ORAL_TABLET | Freq: Every day | ORAL | 3 refills | Status: DC

## 2023-10-15 ENCOUNTER — Other Ambulatory Visit: Payer: Self-pay | Admitting: Nurse Practitioner

## 2023-10-15 ENCOUNTER — Telehealth: Payer: Self-pay | Admitting: Nurse Practitioner

## 2023-10-15 DIAGNOSIS — R9389 Abnormal findings on diagnostic imaging of other specified body structures: Secondary | ICD-10-CM

## 2023-10-15 NOTE — Telephone Encounter (Signed)
 Can we let the patient know that it is time to repeat his chest CT. I have placed the order for [facility] on [facility]. They should hear about getting an appointment over the next 2 weeks. If they do not hear they need to let me know

## 2023-10-15 NOTE — Telephone Encounter (Signed)
 Called pt and spoke with Cart[place] on [facility].  Carthens stated that she called to reschedule CT scan but received no answer.  States she will call and try again. Advised to her that if she cannot reach them to contact our office.  Understood and verbalized understanding.

## 2023-12-04 ENCOUNTER — Other Ambulatory Visit: Payer: Self-pay | Admitting: Nurse Practitioner

## 2023-12-04 ENCOUNTER — Other Ambulatory Visit: Payer: Self-pay | Admitting: Cardiovascular Disease

## 2023-12-04 DIAGNOSIS — N400 Enlarged prostate without lower urinary tract symptoms: Secondary | ICD-10-CM

## 2023-12-05 NOTE — Telephone Encounter (Signed)
 They have someone listed different than me for PCP, can we call and verify. If I am still taking care of him he needs a visit ASAP

## 2023-12-26 ENCOUNTER — Encounter: Payer: Self-pay | Admitting: Family Medicine

## 2023-12-26 ENCOUNTER — Encounter: Payer: Self-pay | Admitting: Nurse Practitioner

## 2024-02-11 ENCOUNTER — Ambulatory Visit (INDEPENDENT_AMBULATORY_CARE_PROVIDER_SITE_OTHER): Admitting: Nurse Practitioner

## 2024-02-11 VITALS — BP 128/60 | HR 84 | Temp 97.7°F | Ht 73.5 in | Wt 201.0 lb

## 2024-02-11 DIAGNOSIS — Z0000 Encounter for general adult medical examination without abnormal findings: Secondary | ICD-10-CM

## 2024-02-11 DIAGNOSIS — Z126 Encounter for screening for malignant neoplasm of bladder: Secondary | ICD-10-CM

## 2024-02-11 DIAGNOSIS — Z125 Encounter for screening for malignant neoplasm of prostate: Secondary | ICD-10-CM | POA: Diagnosis not present

## 2024-02-11 DIAGNOSIS — I10 Essential (primary) hypertension: Secondary | ICD-10-CM | POA: Diagnosis not present

## 2024-02-11 DIAGNOSIS — E785 Hyperlipidemia, unspecified: Secondary | ICD-10-CM

## 2024-02-11 DIAGNOSIS — R9389 Abnormal findings on diagnostic imaging of other specified body structures: Secondary | ICD-10-CM | POA: Diagnosis not present

## 2024-02-11 DIAGNOSIS — E041 Nontoxic single thyroid nodule: Secondary | ICD-10-CM

## 2024-02-11 NOTE — Progress Notes (Signed)
 "  Established Patient Office Visit  Subjective   Patient ID: [name], male    DOB: [birth year]  Age: 84 y.o. MRN: [id number]  Chief Complaint  Patient presents with   Follow-up    HPI  HTN: Patient currently taking amlodipine  2.5 mg daily, lisinopril  20 mg daily, metoprolol  25 mg daily.  He is followed by cardiology. States that he does not check it at home   AAA: Patient's largest aortic measurement was 4.7 cm and increased from 4.4 cm.  Patient is followed by cardiology  HLD: Maintained on simvastatin  40 mg daily followed by cardiology  BPH: Maintained on Flomax  0.4 mg 2 capsules every day  Thyroid  nodule: History of the same with large enough nodule for FNA.  Patient was unable to be reached to schedule.  Abnormal CT of chest: Patient had CT scan of chest performed On 10/26/2022 that showed post CABG and heavily calcified coronary arteries with atherosclerotic calcifications.  Showed persistent ground glass opacities in the left lung measuring up to 1.1 cm with a new ground glass opacity.  They recommend CT chest follow-up 1 year.  Also noticed a stable 6 mm nodule in the left lower lobe.  With aortic atherosclerosis  for complete physical and follow up of chronic conditions.  Immunizations: -Tetanus: Completed in 2020 -Influenza: up to date  -Shingles:unsure -Pneumonia: unsure   Diet: Fair diet. He is 2 meals a day. He is drinking coffee and tea  Exercise: No regular exercise. Working around [employer] exam: Completes annually.  Dental exam: PRN with dentures     Colonoscopy: Completed in 12/22/2011, repeat 10 years.  Patient overdue but aged out Lung Cancer Screening: N/A  PSA: History of elevated PSA in the past.  Patient would like it rechecked today  Sleep: goes to bed around 9 and he will get up around 6-7. Feels rested.  Sleeps on the couch  Advanced directive: has one.  Will have copy dropped off in the office       Review of Systems   Constitutional:  Negative for chills and fever.  Respiratory:  Negative for shortness of breath.   Cardiovascular:  Negative for chest pain and leg swelling.  Gastrointestinal:  Negative for abdominal pain, blood in stool, constipation, diarrhea, nausea and vomiting.       Bm daily   Genitourinary:  Negative for dysuria and hematuria.       Nocutria x 1-2  Neurological:  Negative for dizziness, tingling and headaches.  Psychiatric/Behavioral:  Negative for hallucinations and suicidal ideas.       Objective:     BP 128/60   Pulse 84   Temp 97.7 F (36.5 C) (Oral)   Ht 6' 1.5 (1.867 m)   Wt 201 lb (91.2 kg)   SpO2 94%   BMI 26.16 kg/m  BP Readings from Last 3 Encounters:  02/11/24 128/60  08/23/23 113/70  06/21/23 120/67   Wt Readings from Last 3 Encounters:  02/11/24 201 lb (91.2 kg)  08/23/23 204 lb 6.4 oz (92.7 kg)  06/21/23 210 lb (95.3 kg)   SpO2 Readings from Last 3 Encounters:  02/11/24 94%  08/23/23 98%  06/21/23 96%      Physical Exam Vitals and nursing note reviewed.  Constitutional:      Appearance: Normal appearance.  HENT:     Right Ear: Tympanic membrane, ear canal and external ear normal.     Left Ear: Tympanic membrane, ear canal and external ear  normal.     Mouth/Throat:     Mouth: Mucous membranes are moist.     Pharynx: Oropharynx is clear.  Eyes:     Extraocular Movements: Extraocular movements intact.     Pupils: Pupils are equal, round, and reactive to light.  Cardiovascular:     Rate and Rhythm: Normal rate and regular rhythm.     Pulses: Normal pulses.     Heart sounds: Normal heart sounds.  Pulmonary:     Effort: Pulmonary effort is normal.     Breath sounds: Normal breath sounds.  Abdominal:     General: Bowel sounds are normal. There is no distension.     Palpations: There is no mass.     Tenderness: There is no abdominal tenderness.     Hernia: A hernia is present.   Musculoskeletal:     Right lower leg: No edema.      Left lower leg: No edema.  Lymphadenopathy:     Cervical: No cervical adenopathy.  Skin:    General: Skin is warm.  Neurological:     General: No focal deficit present.     Mental Status: He is alert.     Deep Tendon Reflexes:     Reflex Scores:      Bicep reflexes are 2+ on the right side and 2+ on the left side.      Patellar reflexes are 2+ on the right side and 2+ on the left side.    Comments: Bilateral upper and lower extremity strength 5/5  Psychiatric:        Mood and Affect: Mood normal.        Behavior: Behavior normal.        Thought Content: Thought content normal.        Judgment: Judgment normal.      No results found for any visits on 02/11/24.    The ASCVD Risk score (Arnett DK, et al., 2019) failed to calculate for the following reasons:   The 2019 ASCVD risk score is only valid for ages 24 to 64   Risk score cannot be calculated because patient has a medical history suggesting prior/existing ASCVD   * - Cholesterol units were assumed    Assessment & Plan:   Problem List Items Addressed This Visit       Cardiovascular and Mediastinum   HYPERTENSION, BENIGN - Primary   History of the same.  Patient currently maintained on lisinopril  20 mg daily, metoprolol  25 mg daily, amlodipine  2.5 mg daily.  Patient is to follow with cardiology continue continue taking medication as prescribed      Relevant Orders   CBC with Differential/Platelet   Comprehensive metabolic panel with GFR   TSH     Endocrine   Thyroid  nodule   History of the same lost to follow-up.  Repeat ultrasound of thyroid  before ordering FNA if applicable      Relevant Orders   US  THYROID      Other   Hyperlipidemia   History of same.  Patient currently maintained on simvastatin  40 mg daily pending lipid panel today      Relevant Orders   Lipid panel   Preventative health care   Discussed age-appropriate immunizations and screening exams.  Did review patient's personal, surgical,  social, family histories.  Patient is up-to-date with all age-appropriate vaccinations he would like.  Patient is aged out of CRC screening.  Request PSA today for prostate cancer screening.  Patient was given information at discharge  about preventative healthcare maintenance with anticipatory guidance      Relevant Orders   CBC with Differential/Platelet   Comprehensive metabolic panel with GFR   TSH   Abnormal chest CT   Patient overdue for repeat CT of chest.  Order placed today      Relevant Orders   CT Chest Wo Contrast   Other Visit Diagnoses       Screening for prostate cancer       Relevant Orders   PSA, Medicare     Screening for bladder cancer       Relevant Orders   Urine Microscopic       Return in about 6 months (around 08/10/2024) for BP recheck.    Adina Crandall, NP  "

## 2024-02-11 NOTE — Assessment & Plan Note (Signed)
 History of the same lost to follow-up.  Repeat ultrasound of thyroid  before ordering FNA if applicable

## 2024-02-11 NOTE — Assessment & Plan Note (Signed)
 History of the same.  Patient currently maintained on lisinopril  20 mg daily, metoprolol  25 mg daily, amlodipine  2.5 mg daily.  Patient is to follow with cardiology continue continue taking medication as prescribed

## 2024-02-11 NOTE — Assessment & Plan Note (Signed)
 History of same.  Patient currently maintained on simvastatin  40 mg daily pending lipid panel today

## 2024-02-11 NOTE — Assessment & Plan Note (Signed)
 Patient overdue for repeat CT of chest.  Order placed today

## 2024-02-11 NOTE — Patient Instructions (Signed)
 Nice to see you today I will be in touch with the labs once I have them  [facility] - enter through [facility] [address] [place], [place], [address] Open 7am-5pm [phone] Is where we are going to get the CT and the ultrasound done.  Follow up with me in 6 months, sooner if you need me

## 2024-02-11 NOTE — Assessment & Plan Note (Signed)
 Discussed age-appropriate immunizations and screening exams.  Did review patient's personal, surgical, social, family histories.  Patient is up-to-date with all age-appropriate vaccinations he would like.  Patient is aged out of CRC screening.  Request PSA today for prostate cancer screening.  Patient was given information at discharge about preventative healthcare maintenance with anticipatory guidance

## 2024-02-12 LAB — COMPREHENSIVE METABOLIC PANEL WITH GFR
ALT: 18 U/L (ref 3–53)
AST: 16 U/L (ref 5–37)
Albumin: 3.8 g/dL (ref 3.5–5.2)
Alkaline Phosphatase: 68 U/L (ref 39–117)
BUN: 18 mg/dL (ref 6–23)
CO2: 27 meq/L (ref 19–32)
Calcium: 8.4 mg/dL (ref 8.4–10.5)
Chloride: 104 meq/L (ref 96–112)
Creatinine, Ser: 1.14 mg/dL (ref 0.40–1.50)
GFR: 58.73 mL/min — ABNORMAL LOW
Glucose, Bld: 144 mg/dL — ABNORMAL HIGH (ref 70–99)
Potassium: 4 meq/L (ref 3.5–5.1)
Sodium: 139 meq/L (ref 135–145)
Total Bilirubin: 0.7 mg/dL (ref 0.2–1.2)
Total Protein: 6.4 g/dL (ref 6.0–8.3)

## 2024-02-12 LAB — PSA, MEDICARE: PSA: 7.84 ng/mL — ABNORMAL HIGH (ref 0.10–4.00)

## 2024-02-12 LAB — URINALYSIS, MICROSCOPIC ONLY

## 2024-02-12 LAB — CBC WITH DIFFERENTIAL/PLATELET
Basophils Absolute: 0 K/uL (ref 0.0–0.1)
Basophils Relative: 0.4 % (ref 0.0–3.0)
Eosinophils Absolute: 0.3 K/uL (ref 0.0–0.7)
Eosinophils Relative: 3 % (ref 0.0–5.0)
HCT: 43.6 % (ref 39.0–52.0)
Hemoglobin: 14.6 g/dL (ref 13.0–17.0)
Lymphocytes Relative: 9.4 % — ABNORMAL LOW (ref 12.0–46.0)
Lymphs Abs: 0.8 K/uL (ref 0.7–4.0)
MCHC: 33.5 g/dL (ref 30.0–36.0)
MCV: 95.8 fl (ref 78.0–100.0)
Monocytes Absolute: 0.6 K/uL (ref 0.1–1.0)
Monocytes Relative: 6.7 % (ref 3.0–12.0)
Neutro Abs: 7.1 K/uL (ref 1.4–7.7)
Neutrophils Relative %: 80.5 % — ABNORMAL HIGH (ref 43.0–77.0)
Platelets: 292 K/uL (ref 150.0–400.0)
RBC: 4.55 Mil/uL (ref 4.22–5.81)
RDW: 15 % (ref 11.5–15.5)
WBC: 8.9 K/uL (ref 4.0–10.5)

## 2024-02-12 LAB — LIPID PANEL
Cholesterol: 104 mg/dL (ref 28–200)
HDL: 32.2 mg/dL — ABNORMAL LOW
LDL Cholesterol: 57 mg/dL (ref 10–99)
NonHDL: 71.61
Total CHOL/HDL Ratio: 3
Triglycerides: 74 mg/dL (ref 10.0–149.0)
VLDL: 14.8 mg/dL (ref 0.0–40.0)

## 2024-02-12 LAB — TSH: TSH: 1.31 u[IU]/mL (ref 0.35–5.50)

## 2024-02-15 ENCOUNTER — Ambulatory Visit: Payer: Self-pay | Admitting: Nurse Practitioner

## 2024-02-15 DIAGNOSIS — E041 Nontoxic single thyroid nodule: Secondary | ICD-10-CM

## 2024-02-18 ENCOUNTER — Telehealth: Payer: Self-pay | Admitting: Nurse Practitioner

## 2024-02-18 ENCOUNTER — Ambulatory Visit
Admission: RE | Admit: 2024-02-18 | Discharge: 2024-02-18 | Disposition: A | Source: Ambulatory Visit | Attending: Nurse Practitioner

## 2024-02-18 ENCOUNTER — Ambulatory Visit
Admission: RE | Admit: 2024-02-18 | Discharge: 2024-02-18 | Disposition: A | Discharge status: Home / Self Care | Source: Ambulatory Visit | Attending: Nurse Practitioner | Admitting: Nurse Practitioner

## 2024-02-18 DIAGNOSIS — R9389 Abnormal findings on diagnostic imaging of other specified body structures: Secondary | ICD-10-CM | POA: Diagnosis present

## 2024-02-18 DIAGNOSIS — N400 Enlarged prostate without lower urinary tract symptoms: Secondary | ICD-10-CM

## 2024-02-18 DIAGNOSIS — E041 Nontoxic single thyroid nodule: Secondary | ICD-10-CM | POA: Insufficient documentation

## 2024-02-18 MED ORDER — AMLODIPINE BESYLATE 2.5 MG PO TABS: 2.5000 mg | ORAL_TABLET | Freq: Every day | ORAL | 1 refills | Status: DC

## 2024-02-18 NOTE — Addendum Note (Signed)
 Addended by: WENDEE LYNWOOD HERO on: 02/18/2024 03:31 PM   Modules accepted: Orders

## 2024-02-18 NOTE — Telephone Encounter (Signed)
 Medication sent to requested pharmacy.

## 2024-02-18 NOTE — Telephone Encounter (Signed)
 Copied from CRM [id number]. Topic: Clinical - Medical Advice >> Feb 18, 2024  1:49 PM Charolett L wrote: Reason for CRM:  [name] would like a call back once medication is approved [phone]

## 2024-02-18 NOTE — Telephone Encounter (Signed)
 Called and spoke with [name] on dpr Informed of script sent to pharmacy.  No questions or concerns.

## 2024-02-18 NOTE — Telephone Encounter (Signed)
 Copied from CRM [id number]. Topic: Clinical - Medication Refill >> Feb 18, 2024  1:44 PM Shereese L wrote: Medication: amLODipine  (NORVASC ) 2.5 MG tablet  Has the patient contacted their pharmacy? Yes (Agent: If no, request that the patient contact the pharmacy for the refill. If patient does not wish to contact the pharmacy document the reason why and proceed with request.) (Agent: If yes, when and what did the pharmacy advise?)  This is the patient's preferred pharmacy:   [facility] [address], [place] - [address] [place] [address] Phone: [phone] Fax: [phone]  Is this the correct pharmacy for this prescription? Yes If no, delete pharmacy and type the correct one.   Has the prescription been filled recently? Yes  Is the patient out of the medication? Yes  Has the patient been seen for an appointment in the last year OR does the patient have an upcoming appointment? Yes  Can we respond through MyChart? Yes  Agent: Please be advised that Rx refills may take up to 3 business days. We ask that you follow-up with your pharmacy.

## 2024-02-19 NOTE — Telephone Encounter (Signed)
 Copied from CRM [id number]. Topic: Clinical - Lab/Test Results >> Feb 19, 2024 10:53 AM Sophia H wrote: Reason for CRM: Patient's granddaughter is calling in for lab results. Advised per chart note Liver, kidney, electrolytes, thyroid , cholesterol, urine, red and white blood cells look good. His prostate number is still elevated at 7.84 and we generally like that under 4. This could be because of age or because of prostate cancer. I know he was established with urology. I am not sure if he followed up in the 6 months like they asked. He needs to call and let them know that PSA has increased slightly Patients granddaughter verbalized understanding. States she is unsure if the patient has followed up with a urologist and is actually wanting to know if PCP would be able to refer the patient to a new one since he has not mentioned seeing one before. 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

## 2024-02-20 NOTE — Telephone Encounter (Signed)
 He is established with [facility] with Dr. Morene Salines. He was last seen in February 2025  He can call them at [phone]

## 2024-02-20 NOTE — Telephone Encounter (Unsigned)
 Copied from CRM #[id number]. Topic: Clinical - Lab/Test Results >> Feb 20, 2024 12:58 PM Rea C wrote:  From previous Reason for Contact - Call Back - No Documentation: Reason for CRM: Pt's sig other called back to speak with Janae in regards t lab results. She asked if vm can be left because she wont be back off work until 4:30.   [phone] (M)

## 2024-02-27 NOTE — Addendum Note (Signed)
 Addended by: WENDEE LYNWOOD HERO on: 02/27/2024 07:52 AM   Modules accepted: Orders

## 2024-02-27 NOTE — Telephone Encounter (Signed)
-----   Message from [facility] T sent at 02/20/2024  2:55 PM EST ----- Called and spoke with Carthens on DPR.  Relayed information and repeated back to me.  Pt chooses location for FNA in [place] or closest place to their home ([place] [phone]) Gave her information for urology. She asks matt to reach out to [facility] due to new insurance to keep receiving medications.   ----- Message ----- From: Tennie Harlene NOVAK, CMA Sent: 02/20/2024   1:56 PM EST To: Wendee Gunnels  ----- Message from Harlene NOVAK Tennie, CMA sent at 02/20/2024  1:56 PM EST -----

## 2024-02-27 NOTE — Telephone Encounter (Signed)
 FNA ordered for [facility]. Not sure why they need me to contact the insurance. They should send me a request if they need new scripts

## 2024-02-27 NOTE — Telephone Encounter (Unsigned)
 Copied from CRM [id number]. Topic: Clinical - Prescription Issue >> Feb 27, 2024 11:39 AM Alfonso ORN wrote: Reason for CRM: Reason for CRM: [name] called to f/u on medications. Relayed message from [place] regarding ultrasound and medication refill requests. Pt states that [facility] stated they are unable to get prescriptions without provider approval for all medications. She provided updated information for [facility] medicare advantage plan , member id  [phone] and stated that she was told information already on file but was charged a $100 copay for visit.  Please call back to advise , Carthens requests a detailed voicemail as she works.

## 2024-02-28 ENCOUNTER — Telehealth: Payer: Self-pay | Admitting: Nurse Practitioner

## 2024-02-28 ENCOUNTER — Telehealth: Payer: Self-pay

## 2024-02-28 ENCOUNTER — Ambulatory Visit

## 2024-02-28 MED ORDER — AMLODIPINE BESYLATE 2.5 MG PO TABS: 2.5000 mg | ORAL_TABLET | Freq: Every day | ORAL | 1 refills | Status: AC

## 2024-02-28 MED ORDER — LISINOPRIL 20 MG PO TABS: 20.0000 mg | ORAL_TABLET | Freq: Every day | ORAL | 1 refills | Status: AC

## 2024-02-28 MED ORDER — SIMVASTATIN 40 MG PO TABS: 40.0000 mg | ORAL_TABLET | Freq: Every day | ORAL | 3 refills | Status: AC

## 2024-02-28 MED ORDER — TAMSULOSIN HCL 0.4 MG PO CAPS: 0.8000 mg | ORAL_CAPSULE | Freq: Every day | ORAL | 0 refills | Status: AC

## 2024-02-28 MED ORDER — METOPROLOL TARTRATE 25 MG PO TABS: 25.0000 mg | ORAL_TABLET | Freq: Two times a day (BID) | ORAL | 1 refills | Status: AC

## 2024-02-28 NOTE — Telephone Encounter (Signed)
 Called and spoke with Carthens on DPR.  States she will call the office or send a message regarding medications that the patient is running low on.

## 2024-02-28 NOTE — Addendum Note (Signed)
 Addended by: WENDEE LYNWOOD HERO on: 02/28/2024 07:51 AM   Modules accepted: Orders

## 2024-02-28 NOTE — Telephone Encounter (Signed)
 Copied from CRM #[id number]. Topic: General - Transportation >> Feb 28, 2024  2:53 PM Alfonso HERO wrote: Reason for RMF:Mzulmwpwh nurse call asking for a callback

## 2024-02-28 NOTE — Telephone Encounter (Signed)
 Left detailed voicemail for patient and family to call the office back if there are any questions or concerns.

## 2024-02-28 NOTE — Telephone Encounter (Signed)
 Pt had appt earlier today as [place] for flu shot. Per 02/11/24 office note pt not sure when or if had shingles vaccine, or pneumonia vaccine.  Had listed pt was up to date on influenza vaccine.  Unable to reach pt by phone and left v/m for Q Carthens POA (on DPR) to call [facility]. After 4 PM pt came in and Ms Carthens was in car per pt talking with Dr office. I asked which office and  pt did not know. Ms Gerianne came in office when she got off call. Ms Gerianne wanted me to ck to see when pt had pneumonia shot and shingles shot. Ms Lera wants pt to have covid 19 shot.Ms Gerianne thinks pt is due flu shot after 04/29/24. Ms Gerianne said pt got last flu shot at [facility] and some shots at Dr [facility] who is also a friend of pt. I advised Ms Gerianne we need documentation about his immunizations. Ms Gerianne said Adina had requested records from Dr Skeet but Dr Skeet  has not sent them. Ms Gerianne will ck with Dr Helon office about getting records including immun ization records and she will go to [facility] to get [facility] of [facility]. (Not showing on [facility]. ) Ms Carthens said shewill bring POA papers to [facility] since pt has changed doctors. Pt said I could talk with Ms Gerianne today and she is also on DPR. Renae at front desk scheduled Medicare wellness exam with Erminio on 02/29/24. Ms Carthens voiced understanding andwill bring pt to that appt. I am sending note to CHRISTELLA Crandall NP to review upon his return next wk and to let him know that Ms Gerhardt supposed to get immunization records together so CHRISTELLA Crandall NP can make determination on what if any immunizations pt should take. JanaeCMA did speak with Ms Gerianne about med refills and that is in  a separate note.

## 2024-02-28 NOTE — Telephone Encounter (Signed)
 Medications sent to [facility]

## 2024-02-29 ENCOUNTER — Ambulatory Visit

## 2024-02-29 VITALS — BP 128/70 | HR 74 | Ht 73.0 in | Wt 197.0 lb

## 2024-02-29 DIAGNOSIS — Z0000 Encounter for general adult medical examination without abnormal findings: Secondary | ICD-10-CM | POA: Diagnosis not present

## 2024-02-29 NOTE — Progress Notes (Signed)
 "  Chief Complaint  Patient presents with   Medicare Wellness     Subjective:  Please attest and cosign this visit due to patients primary care provider not being in the office at the time the visit was completed.  (Pt of Lynwood Crandall, NP)   [name] is a 84 y.o. male who presents for a Medicare Annual Wellness Visit.  Visit info / Clinical Intake: Medicare Wellness Visit Type:: Subsequent Annual Wellness Visit Persons participating in visit and providing information:: patient Medicare Wellness Visit Mode:: In-person (required for WTM) Interpreter Needed?: No Pre-visit prep was completed: yes AWV questionnaire completed by patient prior to visit?: yes Date:: 02/28/24 Living arrangements:: (!) lives alone; with family/others Patient's Overall Health Status Rating: good Typical amount of pain: some Does pain affect daily life?: no Are you currently prescribed opioids?: no  Dietary Habits and Nutritional Risks How many meals a day?: 3 Eats fruit and vegetables daily?: yes Most meals are obtained by: preparing own meals; having others provide food In the last 2 weeks, have you had any of the following?: none Diabetic:: no  Functional Status Activities of Daily Living (to include ambulation/medication): Independent Ambulation: Independent with device- listed below Home Assistive Devices/Equipment: Eyeglasses Medication Administration: Needs assistance (comment) (girlfriend helps) Is this a change from baseline?: Change from baseline, expected to last >3 days Home Management (perform basic housework or laundry): Needs assistance (comment) Manage your own finances?: yes Primary transportation is: driving; family / friends Concerns about vision?: no *vision screening is required for WTM* Concerns about hearing?: (!) yes Uses hearing aids?: no Hear whispered voice?: (!) no *in-person visit only*  Fall Screening Falls in the past year?: 1 Number of falls in past year: 1  (3) Was there an injury with Fall?: 0 Fall Risk Category Calculator: 2 Patient Fall Risk Level: Moderate Fall Risk  Fall Risk Patient at Risk for Falls Due to: Impaired balance/gait; Other (Comment) (tripped but no injury) Fall risk Follow up: Falls evaluation completed; Falls prevention discussed  Home and Transportation Safety: All rugs have non-skid backing?: N/A, no rugs All stairs or steps have railings?: yes Grab bars in the bathtub or shower?: yes Have non-skid surface in bathtub or shower?: (!) no Good home lighting?: yes Regular seat belt use?: yes Hospital stays in the last year:: no  Cognitive Assessment Difficulty concentrating, remembering, or making decisions? : yes (girlfriend/family helps) Will 6CIT or Mini Cog be Completed: yes What year is it?: 4 points What month is it?: 0 points Give patient an address phrase to remember (5 components): [address] [place], [place] About what time is it?: 0 points Count backwards from 20 to 1: 0 points Say the months of the year in reverse: 4 points Repeat the address phrase from earlier: 10 points 6 CIT Score: 18 points  Advance Directives (For Healthcare) Does Patient Have a Medical Advance Directive?: Yes Does patient want to make changes to medical advance directive?: Yes (Inpatient - patient requests chaplain consult to change a medical advance directive) Type of Advance Directive: Healthcare Power of [place]; Living will Copy of Healthcare Power of Attorney in Chart?: No - copy requested Copy of Living Will in Chart?: No - copy requested  Reviewed/Updated  Reviewed/Updated: Reviewed All (Medical, Surgical, Family, Medications, Allergies, Care Teams, Patient Goals)    Allergies (verified) Codeine   Current Medications (verified) Outpatient Encounter Medications as of 02/29/2024  Medication Sig   acetaminophen  (TYLENOL  8 HOUR) 650 MG CR tablet Take 1 tablet (  650 mg total) by mouth every 8 (eight) hours as needed  for pain or fever.   amLODipine  (NORVASC ) 2.5 MG tablet Take 1 tablet (2.5 mg total) by mouth daily.   aspirin EC 81 MG tablet Take 1 tablet (81 mg total) by mouth daily.   ibuprofen (ADVIL,MOTRIN) 200 MG tablet Take 200 mg by mouth every 6 (six) hours as needed for headache or moderate pain.    lisinopril  (ZESTRIL ) 20 MG tablet Take 1 tablet (20 mg total) by mouth daily.   metoprolol  tartrate (LOPRESSOR ) 25 MG tablet Take 1 tablet (25 mg total) by mouth 2 (two) times daily.   Multiple Vitamins-Minerals (ONE-A-DAY EXTRAS ANTIOXIDANT) CAPS Take by mouth daily.   simvastatin  (ZOCOR ) 40 MG tablet Take 1 tablet (40 mg total) by mouth daily at 6 PM.   tamsulosin  (FLOMAX ) 0.4 MG CAPS capsule Take 2 capsules (0.8 mg total) by mouth daily.   [DISCONTINUED] Bilberry 1000 MG CAPS Take 1 capsule by mouth daily.   [DISCONTINUED] naproxen  (NAPROSYN ) 375 MG tablet Take 1 tablet (375 mg total) by mouth 2 (two) times daily.   [DISCONTINUED] neomycin -bacitracin -polymyxin (NEOSPORIN) ointment Apply 1 application topically every 12 (twelve) hours.   [DISCONTINUED] predniSONE  (DELTASONE ) 20 MG tablet Take 2 tablets (40 mg total) by mouth daily.   [DISCONTINUED] vitamin E 180 MG (400 UNITS) capsule Take 400 Units by mouth daily.   No facility-administered encounter medications on file as of 02/29/2024.    History: Past Medical History:  Diagnosis Date   AAA (abdominal aortic aneurysm)    a. 07/2021 CTA Abd/Pelvis: 4.6cm biloped infrarenal AAA.   Arthritis    CAD (coronary artery disease)    a. 2007 s/p CABG x 4 (LIMA-LAD, VG->Diag, VG->OM->LPDA); b.12/2007 MV: Attenuation artifact, low risk.   Carotid arterial disease    a. 07/2021 U/S: 1-39% bilateral internal carotid artery stenosis.   Diverticulosis    History of kidney stones late 70's early 80's   HLD (hyperlipidemia)    HTN (hypertension)    Kidney stones    Myocardial infarction (HCC) 2008   light   PAD (peripheral artery disease)    a. 07/2021 CTA  Abd/pelvis: suspected hemodynamically significant left common femoral artery stenosis and subtotal occlusion of imaged portions of the bilateral superficial femoral arteries.   Pancreatic mass    a. 07/2021 CTA Abd/Pelvis: 1.6 cm enhancing lesion involving the caudal aspect of the mid body of the pancreas without associated downstream pancreatic atrophy or ductal dilatation seen nonspecific.   Positional vertigo    Pulmonary nodules    Renal artery stenosis    a. 07/2021 CTA Abd: suspected hemodynamically significant R RA stenosis.   Past Surgical History:  Procedure Laterality Date   bypass surgery (otheR)  August 2007   COLONOSCOPY     CORONARY ARTERY BYPASS GRAFT     SHOULDER SURGERY     TONSILLECTOMY     Family History  Problem Relation Age of Onset   Alzheimer's disease Father    Colon cancer Father    Prostate cancer Father    Diabetes Sister    Prostate cancer Brother    Diabetes Mother    Social History   Occupational History   Occupation: Retired  Tobacco Use   Smoking status: Former    Current packs/day: 0.00    Types: Cigarettes    Quit date: 01/30/2013    Years since quitting: 11.0   Smokeless tobacco: Never   Tobacco comments:    Quit in 55  Vaping Use   Vaping status: Never Used  Substance and Sexual Activity   Alcohol use: No   Drug use: No   Sexual activity: Not Currently   Tobacco Counseling Counseling given: Yes Tobacco comments: Quit in 77  SDOH Screenings   Food Insecurity: No Food Insecurity (02/29/2024)  Housing: Unknown (02/29/2024)  Transportation Needs: No Transportation Needs (02/29/2024)  Utilities: Not At Risk (02/29/2024)  Depression (PHQ2-9): Medium Risk (02/29/2024)  Financial Resource Strain: Medium Risk (02/28/2024)  Physical Activity: Sufficiently Active (02/29/2024)  Social Connections: Moderately Isolated (02/29/2024)  Stress: Stress Concern Present (02/29/2024)  Tobacco Use: Medium Risk (02/29/2024)  Health Literacy: Inadequate  Health Literacy (02/29/2024)   See flowsheets for full screening details  Depression Screen PHQ 2 & 9 Depression Scale- Over the past 2 weeks, how often have you been bothered by any of the following problems? Little interest or pleasure in doing things: 0 Feeling down, depressed, or hopeless (PHQ Adolescent also includes...irritable): 0 PHQ-2 Total Score: 0 Trouble falling or staying asleep, or sleeping too much: 1 Feeling tired or having little energy: 0 Poor appetite or overeating (PHQ Adolescent also includes...weight loss): 0 Feeling bad about yourself - or that you are a failure or have let yourself or your family down: 0 Trouble concentrating on things, such as reading the newspaper or watching television (PHQ Adolescent also includes...like school work): 3 Moving or speaking so slowly that other people could have noticed. Or the opposite - being so fidgety or restless that you have been moving around a lot more than usual: 1 Thoughts that you would be better off dead, or of hurting yourself in some way: 0 PHQ-9 Total Score: 5 If you checked off any problems, how difficult have these problems made it for you to do your work, take care of things at home, or get along with other people?: Somewhat difficult  Depression Treatment Depression Interventions/Treatment : Patient refuses Treatment     Goals Addressed               This Visit's Progress     Patient Stated (pt-stated)        Patient stated he plans to keep walking, driving, and continue eating healthy             Objective:    Today's Vitals   02/29/24 1449  BP: 128/70  Pulse: 74  SpO2: 96%  Weight: 197 lb (89.4 kg)  Height: 6' 1 (1.854 m)   Body mass index is 25.99 kg/m.  Hearing/Vision screen Hearing Screening - Comments:: Denies hearing difficulties   Vision Screening - Comments:: Denies vision concerns - up to date with routine eye exams with Ophthalmologist  Immunizations and Health  Maintenance Health Maintenance  Topic Date Due   Zoster Vaccines- Shingrix (2 of 2) 09/10/2020   COVID-19 Vaccine (4 - 2025-26 season) 10/01/2023   Influenza Vaccine  04/29/2024 (Originally 08/31/2023)   Medicare Annual Wellness (AWV)  02/28/2025   DTaP/Tdap/Td (3 - Td or Tdap) 10/29/2032   Pneumococcal Vaccine: 50+ Years  Completed   Meningococcal B Vaccine  Aged Out        Assessment/Plan:  This is a routine wellness examination for [place].  Patient Care Team: Wendee Lynwood HERO, NP as PCP - General (Nurse Practitioner) Perla Evalene PARAS, MD as PCP - Cardiology (Cardiology) Perla Evalene PARAS, MD as Consulting Physician (Cardiology)  I have personally reviewed and noted the following in the patients chart:   Medical and social history Use of alcohol,  tobacco or illicit drugs  Current medications and supplements including opioid prescriptions. Functional ability and status Nutritional status Physical activity Advanced directives List of other physicians Hospitalizations, surgeries, and ER visits in previous 12 months Vitals Screenings to include cognitive, depression, and falls Referrals and appointments  No orders of the defined types were placed in this encounter.  In addition, I have reviewed and discussed with patient certain preventive protocols, quality metrics, and best practice recommendations. A written personalized care plan for preventive services as well as general preventive health recommendations were provided to patient.   Verdie CHRISTELLA Saba, CMA   02/29/2024   Return in 1 year (on 02/28/2025).  After Visit Summary: (In Person-Declined) Patient declined AVS at this time.  Nurse Notes: scheduled a f/u appt w/PCP to discuss results of 6CIT in 03/2024; scheduled 2027 AWV appt "

## 2024-02-29 NOTE — Patient Instructions (Addendum)
 [name],  Thank you for taking the time for your Medicare Wellness Visit. I appreciate your continued commitment to your health goals. Please review the care plan we discussed, and feel free to reach out if I can assist you further.  Please note that Annual Wellness Visits do not include a physical exam. Some assessments may be limited, especially if the visit was conducted virtually. If needed, we may recommend an in-person follow-up with your provider.  Ongoing Care Seeing your primary care provider every 3 to 6 months helps us  monitor your health and provide consistent, personalized care.   Referrals If a referral was made during today's visit and you haven't received any updates within two weeks, please contact the referred provider directly to check on the status.  Recommended Screenings:  Health Maintenance  Topic Date Due   Zoster (Shingles) Vaccine (2 of 2) 09/10/2020   COVID-19 Vaccine (4 - 2025-26 season) 10/01/2023   Flu Shot  04/29/2024*   Medicare Annual Wellness Visit  02/28/2025   DTaP/Tdap/Td vaccine (3 - Td or Tdap) 10/29/2032   Pneumococcal Vaccine for age over 2  Completed   Meningitis B Vaccine  Aged Out  *Topic was postponed. The date shown is not the original due date.       06/16/2020   10:17 AM  Advanced Directives  Does Patient Have a Medical Advance Directive? No    Vision: Annual vision screenings are recommended for early detection of glaucoma, cataracts, and diabetic retinopathy. These exams can also reveal signs of chronic conditions such as diabetes and high blood pressure.  Dental: Annual dental screenings help detect early signs of oral cancer, gum disease, and other conditions linked to overall health, including heart disease and diabetes.

## 2024-04-10 ENCOUNTER — Ambulatory Visit: Admitting: Nurse Practitioner

## 2024-04-14 ENCOUNTER — Ambulatory Visit: Admitting: Nurse Practitioner

## 2024-04-28 ENCOUNTER — Encounter: Admitting: Nurse Practitioner

## 2024-08-13 ENCOUNTER — Encounter: Admitting: Nurse Practitioner

## 2025-03-03 ENCOUNTER — Ambulatory Visit
